# Patient Record
Sex: Male | Born: 1955
Health system: Southern US, Community
[De-identification: ages and names within clinical notes are randomized; demographics above are authoritative.]

## PROBLEM LIST (undated history)

## (undated) DIAGNOSIS — Z8669 Personal history of other diseases of the nervous system and sense organs: Secondary | ICD-10-CM

## (undated) DIAGNOSIS — T4145XA Adverse effect of unspecified anesthetic, initial encounter: Secondary | ICD-10-CM

## (undated) DIAGNOSIS — Z9889 Other specified postprocedural states: Secondary | ICD-10-CM

## (undated) DIAGNOSIS — E785 Hyperlipidemia, unspecified: Secondary | ICD-10-CM

## (undated) DIAGNOSIS — T8859XA Other complications of anesthesia, initial encounter: Secondary | ICD-10-CM

## (undated) DIAGNOSIS — S46219A Strain of muscle, fascia and tendon of other parts of biceps, unspecified arm, initial encounter: Secondary | ICD-10-CM

## (undated) DIAGNOSIS — R519 Headache, unspecified: Secondary | ICD-10-CM

## (undated) DIAGNOSIS — R51 Headache: Secondary | ICD-10-CM

## (undated) HISTORY — DX: Other specified postprocedural states: Z98.890

## (undated) HISTORY — DX: Hyperlipidemia, unspecified: E78.5

## (undated) HISTORY — DX: Personal history of other diseases of the nervous system and sense organs: Z86.69

## (undated) HISTORY — PX: SKULL FRACTURE ELEVATION: SHX781

---

## 1998-11-16 ENCOUNTER — Ambulatory Visit (HOSPITAL_COMMUNITY): Admission: RE | Admit: 1998-11-16 | Discharge: 1998-11-16 | Payer: Self-pay | Admitting: Specialist

## 1998-11-16 ENCOUNTER — Encounter: Payer: Self-pay | Admitting: Specialist

## 1999-10-31 ENCOUNTER — Encounter: Payer: Self-pay | Admitting: Emergency Medicine

## 1999-10-31 ENCOUNTER — Emergency Department (HOSPITAL_COMMUNITY): Admission: EM | Admit: 1999-10-31 | Discharge: 1999-10-31 | Payer: Self-pay | Admitting: Emergency Medicine

## 2002-07-28 ENCOUNTER — Emergency Department (HOSPITAL_COMMUNITY): Admission: EM | Admit: 2002-07-28 | Discharge: 2002-07-29 | Payer: Self-pay | Admitting: Emergency Medicine

## 2002-12-27 ENCOUNTER — Encounter: Payer: Self-pay | Admitting: Emergency Medicine

## 2002-12-27 ENCOUNTER — Emergency Department (HOSPITAL_COMMUNITY): Admission: EM | Admit: 2002-12-27 | Discharge: 2002-12-27 | Payer: Self-pay | Admitting: Emergency Medicine

## 2008-09-08 ENCOUNTER — Ambulatory Visit (HOSPITAL_COMMUNITY): Admission: RE | Admit: 2008-09-08 | Discharge: 2008-09-08 | Payer: Self-pay | Admitting: Family Medicine

## 2009-09-26 ENCOUNTER — Ambulatory Visit (HOSPITAL_COMMUNITY): Admission: RE | Admit: 2009-09-26 | Discharge: 2009-09-26 | Payer: Self-pay | Admitting: Family Medicine

## 2010-10-31 NOTE — Procedures (Signed)
Kenneth Moon, Kenneth Moon                  ACCOUNT NO.:  [id number]   MEDICAL RECORD NO.:  [id number]          PATIENT TYPE:  OUT   LOCATION:  [facility]                          FACILITY:  [facility]   PHYSICIAN:  Scott A. Gerda Diss, MD    DATE OF BIRTH:  1955-10-18   DATE OF PROCEDURE:  DATE OF DISCHARGE:                                  STRESS TEST   Standard Bruce protocol stress test.   INDICATIONS:  DOE with activity.   Resting EKG did not show any acute changes, had a normal ST segments,  slight delayed RS progression.  Resting blood pressure 122/78.   Heart rate responds to exercise.  The patient's heart rate gradually  increased in the maximum of 170 at stage IV.   ST responds to exercise.  The patient did not have any ST segment  depressions with exercise.   Arrhythmias.  The patient did not have any arrhythmias.   Symptomatology.  By the time, the patient hit his maximum heart rate, he  did have some slight increased respiratory effort.   Recovery phase - uneventful.  The patient did not have any symptoms of  chest pain or discomfort and the heart rate gradually slowed down in the  proper measure.  Blood pressure came down as well.   Blood pressure response to exercise.  The patient had a maximum  __________ and over all the patient did well in this regard.   Interpretation results - negative stress test.  It is felt that this was  medically indicated because of dyspnea on exertion that he was  experiencing with exercise.  I really told him that I think what is  happening is deconditioning some related to age.  I encouraged him to  exercise 30-40 minutes and keep in his target heart range of 130s to  140s in the low to mid 150s and that if he starts experiencing anything  unusual, he should follow up.  Otherwise, he ought to keep doing the  exercise and try to get it done at least 4 times a week.      Scott A. Gerda Diss, MD  Electronically Signed     SAL/MEDQ  D:  09/08/2008  T:   09/09/2008  Job:  [id number]

## 2012-11-25 ENCOUNTER — Encounter: Payer: Self-pay | Admitting: *Deleted

## 2012-11-26 ENCOUNTER — Encounter: Payer: Self-pay | Admitting: Family Medicine

## 2012-11-26 ENCOUNTER — Ambulatory Visit (INDEPENDENT_AMBULATORY_CARE_PROVIDER_SITE_OTHER): Payer: 59 | Admitting: Family Medicine

## 2012-11-26 VITALS — BP 140/82 | Wt 188.6 lb

## 2012-11-26 DIAGNOSIS — G43909 Migraine, unspecified, not intractable, without status migrainosus: Secondary | ICD-10-CM | POA: Insufficient documentation

## 2012-11-26 MED ORDER — SILDENAFIL CITRATE 100 MG PO TABS: 100.0000 mg | ORAL_TABLET | ORAL | Status: DC | PRN

## 2012-11-26 MED ORDER — SUMATRIPTAN SUCCINATE 50 MG PO TABS: ORAL_TABLET | ORAL | Status: DC

## 2012-11-26 NOTE — Progress Notes (Signed)
  Subjective:    Patient ID: Kenneth Moon, male    DOB: 02/05/1956, 57 y.o.   MRN: [id number]  Migraine  This is a chronic problem. The current episode started 1 to 4 weeks ago. The problem occurs intermittently.  Frequent headaches over the past several weeks. Up to 3 in a week. Now actually doing better. He states it may be related to the use of caffeine in his drinks. He states overall he feels he is doing pretty well. No other particular troubles. He states he didn't really have anything to take. He is actually doing better this week without any severe headaches. He does have a history of low testosterone and some erectile dysfunction denies any other particular problems 20 minutes spent with patient 99213.  3 within 1 week No additional stress Eating healthy and diet   Review of Systems Patient denies fever chills sweats nausea vomiting weight loss rectal bleeding. Denies awakening with the headaches. No double vision with it.    Objective:   Physical Exam Optic disc sharp pupils respond to light EOMI eardrums normal throat is normal neck is supple lungs are clear hearts regular extremities no edema blood pressure good       Assessment & Plan:  Migraines-if he starts having red flags such as double vision or waking at night with headache vomiting with headache for frequent headaches followup immediately otherwise lab work in the near future followup for a wellness exam. May use Imitrex when necessary for severe headache repeat 2 hours later if need be in addition to this consider Topamax if frequent headaches occur no need for imaging her scanning at this point.  Erectile dysfunction-Viagra 100 mg half tablet or whole tablet as directed. Patient does have a history of low testosterone but pops not to be on medication. I agree with this approach.

## 2013-01-05 ENCOUNTER — Encounter (HOSPITAL_COMMUNITY): Payer: Self-pay | Admitting: Emergency Medicine

## 2013-01-05 ENCOUNTER — Emergency Department (HOSPITAL_COMMUNITY): Payer: 59

## 2013-01-05 ENCOUNTER — Emergency Department (HOSPITAL_COMMUNITY)
Admission: EM | Admit: 2013-01-05 | Discharge: 2013-01-05 | Disposition: A | Discharge status: Home / Self Care | Payer: 59 | Attending: Emergency Medicine | Admitting: Emergency Medicine

## 2013-01-05 DIAGNOSIS — R0609 Other forms of dyspnea: Secondary | ICD-10-CM | POA: Insufficient documentation

## 2013-01-05 DIAGNOSIS — R079 Chest pain, unspecified: Secondary | ICD-10-CM

## 2013-01-05 DIAGNOSIS — R0989 Other specified symptoms and signs involving the circulatory and respiratory systems: Secondary | ICD-10-CM | POA: Insufficient documentation

## 2013-01-05 DIAGNOSIS — M546 Pain in thoracic spine: Secondary | ICD-10-CM | POA: Insufficient documentation

## 2013-01-05 DIAGNOSIS — E785 Hyperlipidemia, unspecified: Secondary | ICD-10-CM | POA: Insufficient documentation

## 2013-01-05 DIAGNOSIS — M545 Low back pain, unspecified: Secondary | ICD-10-CM | POA: Insufficient documentation

## 2013-01-05 DIAGNOSIS — G8929 Other chronic pain: Secondary | ICD-10-CM | POA: Insufficient documentation

## 2013-01-05 DIAGNOSIS — R110 Nausea: Secondary | ICD-10-CM | POA: Insufficient documentation

## 2013-01-05 DIAGNOSIS — Z8679 Personal history of other diseases of the circulatory system: Secondary | ICD-10-CM | POA: Insufficient documentation

## 2013-01-05 DIAGNOSIS — M542 Cervicalgia: Secondary | ICD-10-CM | POA: Insufficient documentation

## 2013-01-05 DIAGNOSIS — R071 Chest pain on breathing: Secondary | ICD-10-CM | POA: Insufficient documentation

## 2013-01-05 LAB — POCT I-STAT TROPONIN I: Troponin i, poc: 0.02 ng/mL (ref 0.00–0.08)

## 2013-01-05 LAB — CBC
Hemoglobin: 13.8 g/dL (ref 13.0–17.0)
MCH: 29.6 pg (ref 26.0–34.0)
MCV: 89.3 fL (ref 78.0–100.0)
Platelets: 200 10*3/uL (ref 150–400)
RBC: 4.66 MIL/uL (ref 4.22–5.81)

## 2013-01-05 LAB — COMPREHENSIVE METABOLIC PANEL
CO2: 28 mEq/L (ref 19–32)
Calcium: 9.2 mg/dL (ref 8.4–10.5)
Creatinine, Ser: 1.01 mg/dL (ref 0.50–1.35)
GFR calc Af Amer: >90 mL/min (ref >90)
GFR calc non Af Amer: 82 mL/min — ABNORMAL LOW (ref >90)
Glucose, Bld: 96 mg/dL (ref 70–99)

## 2013-01-05 MED ORDER — IOHEXOL 350 MG/ML SOLN: 100.0000 mL | Freq: Once | INTRAVENOUS | Status: DC | PRN

## 2013-01-05 NOTE — ED Notes (Signed)
Pt here for c/o chest pain that started last week and today pt stated that he called his PCP they told him to come to the ED denies sweating or n/v but stated that he has some sob and pain in his neck and shoulder blade

## 2013-01-05 NOTE — ED Provider Notes (Signed)
History    CSN: [id number] Arrival date & time 01/05/13  1516  First MD Initiated Contact with Patient 01/05/13 1545     Chief Complaint  Patient presents with  . Chest Pain   (Consider location/radiation/quality/duration/timing/severity/associated sxs/prior Treatment) HPI Comments: [name] is a 57 year old man with a history of hyperlipidemia who presents today the emergency department with 10 days of chest tightness. He states that the chest tightness substernal and after approximately 10 or 20 minutes becomes sharp pain between his shoulder blades and in his upper back.  Today while sitting at work he had a similar episode but also had a sharp right-sided neck pain occurring with the episode. Episode just prior to arrival at work lasted approximately 45 minutes.  He reports associated symptoms of feeling dyspnea when the chest pain happens. He did have some nausea with this episode today. He denies diaphoresis.  He is currently pain-free. He denies any previous episodes. He denies any relieving or exacerbating factors.  Patient is a 57 y.o. male presenting with chest pain.  Chest Pain Associated symptoms: no cough and no fever    Past Medical History  Diagnosis Date  . Hyperlipidemia    History reviewed. No pertinent past surgical history. No family history on file. History  Substance Use Topics  . Smoking status: Never Smoker   . Smokeless tobacco: Not on file  . Alcohol Use: No    Family History : His mother died from pancreatic cancer; father had coronary artery disease at 30 years of age  Review of Systems  Constitutional: Negative for fever and appetite change.  HENT: Negative.  Negative for congestion and rhinorrhea.   Eyes: Negative.   Respiratory: Negative for cough and wheezing.        Pt reports episodes of dyspnea along with the episodes of chest pain. Has pleurisy with the chest pain  Cardiovascular: Positive for chest pain.       See history of present  illness  Gastrointestinal: Negative.   Endocrine: Negative.   Genitourinary: Negative.   Musculoskeletal:       Patient reports chronic lower back pain. He also reports upper back pain as per history of present illness  Skin: Negative.   Allergic/Immunologic: Negative.   Neurological:       Increase in migraine symptoms over the past month  Hematological: Negative.   Psychiatric/Behavioral: Negative.   All other systems reviewed and are negative.    Allergies  Allegra and Lipitor  Home Medications   Current Outpatient Rx  Name  Route  Sig  Dispense  Refill  . sildenafil (VIAGRA) 100 MG tablet   Oral   Take 1 tablet (100 mg total) by mouth as needed for erectile dysfunction.   8 tablet   4   . SUMAtriptan (IMITREX) 50 MG tablet      1 at first sign of migraine, then repeat in 2 hours if needed   10 tablet   2    BP 139/67  Pulse 72  Temp(Src) 98.2 F (36.8 C) (Oral)  Resp 20  SpO2 99% Physical Exam  Nursing note and vitals reviewed. CONSTITUTIONAL  Pleasant, well developed, well nourished, alert, non toxic appearing HEENT  normocephalic, atraumatic, external ears normal, nose normal, mucus membranes moist, oropharynx clear EYES  normal conjunctiva, no sclera icterus noted, EOM intact, PERRL NECK  normal ROM, supple, no JVD, trachea normal, no mass CARDIOVASCULAR  RRR. no m/r/g. 2+ radial, femoral and pedal pulses. No LEE edema or  calf TTP PULMONARY/CHEST WALL  normal effort, no respiratory distress, BS normal, no wheezes, no rhonchi, no rales ABDOMINAL  normal appearance, no distension, no mass, no pulsatile mass; soft, non tender, no rigidity, guarding or rebound GU  deferred MUSCULOSKELETAL  Normal range of motion of extremities;  NEUROLOGICAL  patient is awake and responsive;  no significant motor or sensory deficit noted  SKIN  warm, dry, intact, no rash PSYCHIATRIC  No suicidal or homicidal ideations;  Normal affect and expected cognition   ED Course   Procedures (including critical care time) Labs Reviewed  TROPONIN I  CBC  COMPREHENSIVE METABOLIC PANEL  LIPASE, BLOOD   EKG: Normal sinus rhythm rate of 66; PR interval normal; QRS within normal limits; QTC 4:15; borderline R-wave progression in anterior leads.  Results for orders placed during the hospital encounter of 01/05/13  TROPONIN I      Result Value Range   Troponin I <0.30  <0.30 ng/mL  CBC      Result Value Range   WBC 7.5  4.0 - 10.5 K/uL   RBC 4.66  4.22 - 5.81 MIL/uL   Hemoglobin 13.8  13.0 - 17.0 g/dL   HCT 16.1  09.6 - 04.5 %   MCV 89.3  78.0 - 100.0 fL   MCH 29.6  26.0 - 34.0 pg   MCHC 33.2  30.0 - 36.0 g/dL   RDW 40.9  81.1 - 91.4 %   Platelets 200  150 - 400 K/uL  COMPREHENSIVE METABOLIC PANEL      Result Value Range   Sodium 140  135 - 145 mEq/L   Potassium 3.8  3.5 - 5.1 mEq/L   Chloride 104  96 - 112 mEq/L   CO2 28  19 - 32 mEq/L   Glucose, Bld 96  70 - 99 mg/dL   BUN 18  6 - 23 mg/dL   Creatinine, Ser 7.82  0.50 - 1.35 mg/dL   Calcium 9.2  8.4 - 95.6 mg/dL   Total Protein 6.9  6.0 - 8.3 g/dL   Albumin 3.7  3.5 - 5.2 g/dL   AST 20  0 - 37 U/L   ALT 16  0 - 53 U/L   Alkaline Phosphatase 61  39 - 117 U/L   Total Bilirubin 0.4  0.3 - 1.2 mg/dL   GFR calc non Af Amer 82 (*) >90 mL/min   GFR calc Af Amer >90  >90 mL/min  LIPASE, BLOOD      Result Value Range   Lipase 19  11 - 59 U/L  TROPONIN I      Result Value Range   Troponin I <0.30  <0.30 ng/mL  POCT I-STAT TROPONIN I      Result Value Range   Troponin i, poc 0.02  0.00 - 0.08 ng/mL   Comment 3            Dg Chest 2 View  01/05/2013   *RADIOLOGY REPORT*  Clinical Data: Chest pain and shortness of breath  CHEST - 2 VIEW  Comparison: None.  Findings:  Lungs are clear.  Heart size and pulmonary vascularity are normal.  No adenopathy.  No pneumothorax.  There is mild degenerative change in the thoracic spine.  IMPRESSION: No edema or consolidation.   Original Report Authenticated By: Bretta Bang, M.D.   Ct Angio Abdomen W/cm &/or Wo Contrast  01/05/2013   *RADIOLOGY REPORT*  Clinical Data:  Chest pain.  Aortic dissection.  Back pain. Hypotension.  CT  ANGIOGRAPHY CHEST AND ABDOMEN  Technique:  Multidetector CT imaging of the chest and abdomen was performed using the standard protocol during bolus administration of intravenous contrast.  Multiplanar CT image reconstructions including MIPs were obtained to evaluate the vascular anatomy.  Contrast:  100 ml Omnipaque 350.  Comparison:   None.  CTA CHEST  Findings:  Noncontrast imaging the chest demonstrates aortic arch atherosclerosis.  There is no intramural hematoma.  After contrast administration, normal three-vessel aortic arch is identified.  There is no dissection or acute aortic abnormality. The heart appears within normal limits allowing for motion artifact.  There is no pericardial or pleural effusion.  No axillary, mediastinal, or hilar adenopathy.  The lungs shows scattered areas of atelectasis.  There is no airspace disease. There are no aggressive osseous lesions.   Review of the MIP images confirms the above findings.  IMPRESSION: Negative CTA chest.  Negative for dissection.  CTA ABDOMEN  Findings:  Normal appearance of the liver.  Spleen appears within normal limits as well.  Gallbladder, common bile duct and pancreas appear within normal limits.  Bilaterally the adrenal glands appear normal.  There is a left upper pole simple appearing renal cyst. Normal renal enhancement.  Mild respiratory motion artifact is present.  The abdominal aorta is within normal limits.  Single renal arteries are present bilaterally.  The superior mesenteric artery, celiac axis and nine and eight are all patent.  Both common iliac arteries appear normal.  There is no free air or acute intra-abdominal abnormality.   Review of the MIP images confirms the above findings.  IMPRESSION: Negative CTA of the abdomen.  Left renal cyst.   Original Report  Authenticated By: Andreas Newport, M.D.   Ct Angio Chest Aortic Dissect W &/or W/o  01/05/2013   *RADIOLOGY REPORT*  Clinical Data:  Chest pain.  Aortic dissection.  Back pain. Hypotension.  CT ANGIOGRAPHY CHEST AND ABDOMEN  Technique:  Multidetector CT imaging of the chest and abdomen was performed using the standard protocol during bolus administration of intravenous contrast.  Multiplanar CT image reconstructions including MIPs were obtained to evaluate the vascular anatomy.  Contrast:  100 ml Omnipaque 350.  Comparison:   None.  CTA CHEST  Findings:  Noncontrast imaging the chest demonstrates aortic arch atherosclerosis.  There is no intramural hematoma.  After contrast administration, normal three-vessel aortic arch is identified.  There is no dissection or acute aortic abnormality. The heart appears within normal limits allowing for motion artifact.  There is no pericardial or pleural effusion.  No axillary, mediastinal, or hilar adenopathy.  The lungs shows scattered areas of atelectasis.  There is no airspace disease. There are no aggressive osseous lesions.   Review of the MIP images confirms the above findings.  IMPRESSION: Negative CTA chest.  Negative for dissection.  CTA ABDOMEN  Findings:  Normal appearance of the liver.  Spleen appears within normal limits as well.  Gallbladder, common bile duct and pancreas appear within normal limits.  Bilaterally the adrenal glands appear normal.  There is a left upper pole simple appearing renal cyst. Normal renal enhancement.  Mild respiratory motion artifact is present.  The abdominal aorta is within normal limits.  Single renal arteries are present bilaterally.  The superior mesenteric artery, celiac axis and nine and eight are all patent.  Both common iliac arteries appear normal.  There is no free air or acute intra-abdominal abnormality.   Review of the MIP images confirms the above findings.  IMPRESSION: Negative CTA  of the abdomen.  Left renal cyst.    Original Report Authenticated By: Andreas Newport, M.D.      MDM  TIMI risk score of 1 for multiple episodes of angina. EKG shows no significant ST changes. Troponin x2 are negative. Patient's clinical history does not suggest a PE. He does report that the pain radiates to between his shoulder blades; therefore, CT aortogram was ordered and negative. The rest of his labs are unremarkable there are no signs of pneumonia, pneumothorax, pericarditis, or esophageal rupture.  I spoke to the patient at length regarding differential diagnosis, need for followup, and strict return precautions.  Ashby Dawes, MD 01/05/13 2128

## 2013-01-07 ENCOUNTER — Other Ambulatory Visit: Payer: Self-pay | Admitting: Cardiology

## 2013-01-07 ENCOUNTER — Encounter: Payer: Self-pay | Admitting: *Deleted

## 2013-01-07 ENCOUNTER — Encounter: Payer: Self-pay | Admitting: Family Medicine

## 2013-01-07 ENCOUNTER — Ambulatory Visit (INDEPENDENT_AMBULATORY_CARE_PROVIDER_SITE_OTHER): Payer: 59 | Admitting: Family Medicine

## 2013-01-07 ENCOUNTER — Ambulatory Visit (INDEPENDENT_AMBULATORY_CARE_PROVIDER_SITE_OTHER): Payer: 59 | Admitting: Cardiology

## 2013-01-07 ENCOUNTER — Encounter: Payer: Self-pay | Admitting: Cardiology

## 2013-01-07 VITALS — BP 120/80 | Wt 188.4 lb

## 2013-01-07 VITALS — BP 117/77 | HR 60 | Ht 66.0 in | Wt 178.2 lb

## 2013-01-07 DIAGNOSIS — R079 Chest pain, unspecified: Secondary | ICD-10-CM

## 2013-01-07 DIAGNOSIS — E785 Hyperlipidemia, unspecified: Secondary | ICD-10-CM

## 2013-01-07 DIAGNOSIS — E782 Mixed hyperlipidemia: Secondary | ICD-10-CM

## 2013-01-07 DIAGNOSIS — I200 Unstable angina: Secondary | ICD-10-CM

## 2013-01-07 DIAGNOSIS — R9431 Abnormal electrocardiogram [ECG] [EKG]: Secondary | ICD-10-CM | POA: Insufficient documentation

## 2013-01-07 DIAGNOSIS — Z01818 Encounter for other preprocedural examination: Secondary | ICD-10-CM

## 2013-01-07 DIAGNOSIS — Z79899 Other long term (current) drug therapy: Secondary | ICD-10-CM

## 2013-01-07 MED ORDER — NITROGLYCERIN 0.4 MG SL SUBL: 0.4000 mg | SUBLINGUAL_TABLET | SUBLINGUAL | Status: DC | PRN

## 2013-01-07 NOTE — Patient Instructions (Addendum)
Your physician recommends that you schedule a follow-up appointment in: TO BE DETERMINED  Your physician has requested that you have a cardiac catheterization. Cardiac catheterization is used to diagnose and/or treat various heart conditions. Doctors may recommend this procedure for a number of different reasons. The most common reason is to evaluate chest pain. Chest pain can be a symptom of coronary artery disease (CAD), and cardiac catheterization can show whether plaque is narrowing or blocking your heart's arteries. This procedure is also used to evaluate the valves, as well as measure the blood flow and oxygen levels in different parts of your heart. For further information please visit [email or web address] Please follow instruction sheet, as given.01-09-13 AT 8:30AM  Your physician has recommended you make the following change in your medication:   1) HOLD VIAGRA AT THIS TIME UNTIL AFTER YOUR PROCEDURE  Your physician recommends that you return for lab work in: TODAY (SLIPS GIVEN FOR BMET,PT,PT/INR) NOTED PT TO HAVE BMET DRAWN FOR PCP TODAY, ADVISED PT TO TAKE ORDER FORMS WITH HIM TO HAVE LAB CLARIFY ORDERS GIVEN TO COMBINE NEED FROM BOTH PCP AND DR SM

## 2013-01-07 NOTE — Assessment & Plan Note (Signed)
Has not been on medical therapy, reports allergy to Lipitor previously. Followup FLP is pending.

## 2013-01-07 NOTE — Assessment & Plan Note (Signed)
Recent onset symptoms as outlined concerning for accelerating angina. ECG shows poor R wave progression anteriorly, recent cardiac markers were normal however. Principle risk factors include age and gender, history of hyperlipidemia. Followup FLP is pending at this time. We have discussed diagnostic possibilities, both noninvasive and invasive techniques, after reviewing the risks and benefits, plan is to proceed with a diagnostic cardiac catheterization to most clearly evaluate his coronary anatomy and determine if any revascularization options need to be considered. Have asked him not to use Viagra in the short term, particularly not along with nitroglycerin. Continue aspirin. Check coagulation studies to complete recent bloodwork.

## 2013-01-07 NOTE — Progress Notes (Signed)
  Subjective:    Patient ID: Kenneth Ponto., male    DOB: June 08, 1956, 57 y.o.   MRN: [id number]  HPI This patient presents today having intermittent chest discomforts over the past few weeks more progressive over this past weekend he noted chest discomfort with playing golf then he noticed chest discomfort on Monday morning that radiated into his neck then he noticed earlier today chest discomfort with walking the dog. With one of the episodes he did have shortness of breath and felt somewhat clammy. He denies breaking out in any sweats or having any wheezing or any type of respiratory illness recently. He had been doing some yard work for his mother but he did not think that caused his chest discomfort. Patient has not had this problem before. He does have a history hyperlipidemia for which he was being treated but approximately 3 years ago he stopped taking the medication and he related that the medication did cause some discomfort. He is not a smoker. He does try to eat relatively healthy. Family history reviewed   Review of Systems See above. Records from the emergency department were reviewed with the patient while he was there I went over his lab work also CT and she'll and his EKG.    Objective:   Physical Exam Lungs are clear heart is regular no murmurs. Pulses are normal. Abdomen soft slightly obese extremities no edema skin warm dry neurologic grossly normal  EKG was repeated has some lateral changes but nothing obvious. Seemingly comparable to what was completed in the ER on the 22nd  Dr. Simona Huh was spoken with regarding the patient's issues. They agreed to see the patient later today       Assessment & Plan:  Chest discomfort-worrisome for cardiac origin. This patient at the very least will need a stress Myoview but because of the crescendo of his symptoms catheterization may be necessary in order to be certain that there is not vascular blockage is building up. He will go  ahead and do lab work in more than likely will need to start on a different statin. Await cardiology consultation.

## 2013-01-07 NOTE — Assessment & Plan Note (Signed)
As noted above. Recent cardiac markers normal.

## 2013-01-07 NOTE — Progress Notes (Signed)
Clinical Summary Kenneth Moon is a 57 y.o.male referred for cardiology consultation by Dr. Gerda Diss who saw the patient earlier today. He presents with a 2 week history of recurring chest "tightness" that began after he had been off from work, doing a lot of activities around the home and outdoors. Symptoms have been recurring with exertion, sometimes at rest, also associated with a discomfort in his mid shoulder blade area radiating up into the neck. Symptoms have become more frequent. He was seen in the ER recently on the 21st, normal troponin I at that time. Chest x-ray showed no acute findings. Chest CTA demonstrated aortic arch atherosclerosis, no hematoma or dissection.  Today's ECG shows sinus rhythm at 82 with leftward axis, lead motion artifact, and poor R-wave progression in the anterior leads, rule out old infarct pattern. Records indicate a negative GXT in 2010. He has had no followup ischemic testing since then.  At baseline, he reports no exertional chest pain or breathlessness typically. He has not been on medical therapy for hyperlipidemia for several years.   Allergies  Allergen Reactions  . Allegra (Fexofenadine Hcl)     Felt bad  . Lipitor (Atorvastatin)     Headache    Current Outpatient Prescriptions  Medication Sig Dispense Refill  . aspirin 81 MG tablet Take 81 mg by mouth daily.      . nitroGLYCERIN (NITROSTAT) 0.4 MG SL tablet Place 1 tablet (0.4 mg total) under the tongue every 5 (five) minutes as needed for chest pain. For a total of 3 doses if needed  30 tablet  1  . sildenafil (VIAGRA) 100 MG tablet Take 1 tablet (100 mg total) by mouth as needed for erectile dysfunction.  8 tablet  4  . SUMAtriptan (IMITREX) 50 MG tablet 1 at first sign of migraine, then repeat in 2 hours if needed  10 tablet  2   No current facility-administered medications for this visit.    Past Medical History  Diagnosis Date  . Hyperlipidemia   . History of migraine headaches     Past  Surgical History  Procedure Laterality Date  . Skull fracture elevation      Age 82    Family History  Problem Relation Age of Onset  . Cancer Mother   . Cancer Father   . CAD Father     Not premature    Social History Mr. Schoof reports that he has never smoked. He does not have any smokeless tobacco history on file. Mr. Ludington reports that he does not drink alcohol.  Review of Systems No palpitations, dizziness, syncope. No reported bleeding episodes. No orthopnea or PND. No claudication. Otherwise negative.  Physical Examination Filed Vitals:   01/07/13 1220  BP: 117/77  Pulse: 60   Filed Weights   01/07/13 1220  Weight: 178 lb 4 oz (80.854 kg)   Patient appears comfortable at rest. HEENT: Conjunctiva and lids normal, oropharynx clear. Neck: Supple, no elevated JVP or carotid bruits, no thyromegaly. Lungs: Clear to auscultation, nonlabored breathing at rest. Cardiac: Regular rate and rhythm, no S3 or significant systolic murmur, no pericardial rub. Abdomen: Soft, nontender, bowel sounds present, no guarding or rebound. Extremities: No pitting edema, distal pulses 2+. Skin: Warm and dry. Musculoskeletal: No kyphosis. Neuropsychiatric: Alert and oriented x3, affect grossly appropriate.   Problem List and Plan   Accelerating angina Recent onset symptoms as outlined concerning for accelerating angina. ECG shows poor R wave progression anteriorly, recent cardiac markers were normal however. Principle  risk factors include age and gender, history of hyperlipidemia. Followup FLP is pending at this time. We have discussed diagnostic possibilities, both noninvasive and invasive techniques, after reviewing the risks and benefits, plan is to proceed with a diagnostic cardiac catheterization to most clearly evaluate his coronary anatomy and determine if any revascularization options need to be considered. Have asked him not to use Viagra in the short term, particularly not along with  nitroglycerin. Continue aspirin. Check coagulation studies to complete recent bloodwork.  Hyperlipidemia Has not been on medical therapy, reports allergy to Lipitor previously. Followup FLP is pending.  Abnormal ECG As noted above. Recent cardiac markers normal.    Jonelle Sidle, M.D., F.A.C.C.

## 2013-01-08 LAB — HEPATIC FUNCTION PANEL
Albumin: 3.8 g/dL (ref 3.5–5.2)
Alkaline Phosphatase: 55 U/L (ref 39–117)
Indirect Bilirubin: 0.5 mg/dL (ref 0.0–0.9)
Total Bilirubin: 0.6 mg/dL (ref 0.3–1.2)

## 2013-01-08 LAB — BASIC METABOLIC PANEL
BUN: 16 mg/dL (ref 6–23)
CO2: 30 mEq/L (ref 19–32)
Chloride: 106 mEq/L (ref 96–112)
Creat: 1.09 mg/dL (ref 0.50–1.35)

## 2013-01-08 LAB — LIPID PANEL: LDL Cholesterol: 208 mg/dL — ABNORMAL HIGH (ref 0–99)

## 2013-01-08 LAB — APTT: aPTT: 30 seconds (ref 24–37)

## 2013-01-09 ENCOUNTER — Encounter (HOSPITAL_BASED_OUTPATIENT_CLINIC_OR_DEPARTMENT_OTHER)
Admission: RE | Disposition: A | Discharge status: Home / Self Care | Payer: Self-pay | Source: Ambulatory Visit | Attending: Cardiology

## 2013-01-09 ENCOUNTER — Inpatient Hospital Stay (HOSPITAL_BASED_OUTPATIENT_CLINIC_OR_DEPARTMENT_OTHER)
Admission: RE | Admit: 2013-01-09 | Discharge: 2013-01-09 | Disposition: A | Discharge status: Home / Self Care | Payer: 59 | Source: Ambulatory Visit | Attending: Cardiology | Admitting: Cardiology

## 2013-01-09 DIAGNOSIS — R9431 Abnormal electrocardiogram [ECG] [EKG]: Secondary | ICD-10-CM | POA: Insufficient documentation

## 2013-01-09 DIAGNOSIS — E7800 Pure hypercholesterolemia, unspecified: Secondary | ICD-10-CM | POA: Insufficient documentation

## 2013-01-09 DIAGNOSIS — I200 Unstable angina: Secondary | ICD-10-CM

## 2013-01-09 DIAGNOSIS — R079 Chest pain, unspecified: Secondary | ICD-10-CM | POA: Insufficient documentation

## 2013-01-09 SURGERY — JV LEFT HEART CATHETERIZATION WITH CORONARY ANGIOGRAM
Anesthesia: Moderate Sedation

## 2013-01-09 MED ORDER — SODIUM CHLORIDE 0.9 % IV SOLN: INTRAVENOUS | Status: DC

## 2013-01-09 MED ORDER — ASPIRIN 81 MG PO CHEW
324.0000 mg | CHEWABLE_TABLET | ORAL | Status: AC
  Administered 2013-01-09: 243 mg via ORAL

## 2013-01-09 MED ORDER — SODIUM CHLORIDE 0.9 % IV SOLN: 250.0000 mL | INTRAVENOUS | Status: DC | PRN

## 2013-01-09 MED ORDER — SODIUM CHLORIDE 0.9 % IJ SOLN: 3.0000 mL | Freq: Two times a day (BID) | INTRAMUSCULAR | Status: DC

## 2013-01-09 MED ORDER — SODIUM CHLORIDE 0.9 % IJ SOLN
3.0000 mL | INTRAMUSCULAR | Status: DC | PRN
Start: 2013-01-09 — End: 2013-01-09

## 2013-01-09 MED ORDER — ONDANSETRON HCL 4 MG/2ML IJ SOLN: 4.0000 mg | Freq: Four times a day (QID) | INTRAMUSCULAR | Status: DC | PRN

## 2013-01-09 MED ORDER — SODIUM CHLORIDE 0.9 % IV SOLN: 1.0000 mL/kg/h | INTRAVENOUS | Status: DC

## 2013-01-09 MED ORDER — ACETAMINOPHEN 325 MG PO TABS: 650.0000 mg | ORAL_TABLET | ORAL | Status: DC | PRN

## 2013-01-09 NOTE — OR Nursing (Signed)
Negative Allen's test right hand 

## 2013-01-09 NOTE — OR Nursing (Signed)
Tegaderm dressing applied, site level 0, bedrest begins at 0930 

## 2013-01-09 NOTE — CV Procedure (Signed)
   Cardiac Catheterization Procedure Note  Name: Kenneth Moon. MRN: [id number] DOB: December 13, 1955  Procedure: Left Heart Cath, Selective Coronary Angiography, LV angiography  Indication: 57 yo WM with recent onset of exertional chest pain. History of hypercholesterolemia.   Procedural details: The right groin was prepped, draped, and anesthetized with 1% lidocaine. Using modified Seldinger technique, a 4 French sheath was introduced into the right femoral artery. Standard Judkins catheters were used for coronary angiography and left ventriculography. Catheter exchanges were performed over a guidewire. There were no immediate procedural complications. The patient was transferred to the post catheterization recovery area for further monitoring.  Procedural Findings: Hemodynamics:  AO 109/64 mean 85 mm Hg LV 108/17 mm Hg   Coronary angiography: Coronary dominance: right  Left mainstem: Normal  Left anterior descending (LAD): Normal  Left circumflex (LCx): Gives rise to a single large OM then terminates in the AV groove. Normal.  Right coronary artery (RCA): Large, dominant. Normal.  Left ventriculography: Left ventricular systolic function is normal, LVEF is estimated at 55-65%, there is no significant mitral regurgitation   Final Conclusions:   1. Normal coronary anatomy 2. Normal LV function  Recommendations: Risk factor modification.  Theron Arista [facility] 01/09/2013, 9:21 AM

## 2013-01-09 NOTE — Interval H&P Note (Signed)
History and Physical Interval Note:  01/09/2013 9:00 AM  Kenneth E Feltman Jr.  has presented today for surgery, with the diagnosis of abn ekg  The various methods of treatment have been discussed with the patient and family. After consideration of risks, benefits and other options for treatment, the patient has consented to  Procedure(s): JV LEFT HEART CATHETERIZATION WITH CORONARY ANGIOGRAM (N/A) as a surgical intervention .  The patient's history has been reviewed, patient examined, no change in status, stable for surgery.  I have reviewed the patient's chart and labs.  Questions were answered to the patient's satisfaction.    Cath Lab Visit (complete for each Cath Lab visit)  Clinical Evaluation Leading to the Procedure:   ACS: no  Non-ACS:    Anginal Classification: CCS II  Anti-ischemic medical therapy: No Therapy  Non-Invasive Test Results: No non-invasive testing performed  Prior CABG: No previous CABG       Theron Arista [facility] 01/09/2013 9:00 AM

## 2013-01-09 NOTE — OR Nursing (Signed)
Dr Jordan at bedside to discuss results and treatment plan with pt and family 

## 2013-01-09 NOTE — H&P (View-Only) (Signed)
 Clinical Summary [name] is a 57 y.o.male referred for cardiology consultation by Dr. Luking who saw the patient earlier today. He presents with a 2 week history of recurring chest "tightness" that began after he had been off from work, doing a lot of activities around the home and outdoors. Symptoms have been recurring with exertion, sometimes at rest, also associated with a discomfort in his mid shoulder blade area radiating up into the neck. Symptoms have become more frequent. He was seen in the ER recently on the 21st, normal troponin I at that time. Chest x-ray showed no acute findings. Chest CTA demonstrated aortic arch atherosclerosis, no hematoma or dissection.  Today's ECG shows sinus rhythm at 82 with leftward axis, lead motion artifact, and poor R-wave progression in the anterior leads, rule out old infarct pattern. Records indicate a negative GXT in 2010. He has had no followup ischemic testing since then.  At baseline, he reports no exertional chest pain or breathlessness typically. He has not been on medical therapy for hyperlipidemia for several years.   Allergies  Allergen Reactions  . Allegra (Fexofenadine Hcl)     Felt bad  . Lipitor (Atorvastatin)     Headache    Current Outpatient Prescriptions  Medication Sig Dispense Refill  . aspirin 81 MG tablet Take 81 mg by mouth daily.      . nitroGLYCERIN (NITROSTAT) 0.4 MG SL tablet Place 1 tablet (0.4 mg total) under the tongue every 5 (five) minutes as needed for chest pain. For a total of 3 doses if needed  30 tablet  1  . sildenafil (VIAGRA) 100 MG tablet Take 1 tablet (100 mg total) by mouth as needed for erectile dysfunction.  8 tablet  4  . SUMAtriptan (IMITREX) 50 MG tablet 1 at first sign of migraine, then repeat in 2 hours if needed  10 tablet  2   No current facility-administered medications for this visit.    Past Medical History  Diagnosis Date  . Hyperlipidemia   . History of migraine headaches     Past  Surgical History  Procedure Laterality Date  . Skull fracture elevation      Age 7    Family History  Problem Relation Age of Onset  . Cancer Mother   . Cancer Father   . CAD Father     Not premature    Social History Mr. Revoir reports that he has never smoked. He does not have any smokeless tobacco history on file. Mr. Venuti reports that he does not drink alcohol.  Review of Systems No palpitations, dizziness, syncope. No reported bleeding episodes. No orthopnea or PND. No claudication. Otherwise negative.  Physical Examination Filed Vitals:   01/07/13 1220  BP: 117/77  Pulse: 60   Filed Weights   01/07/13 1220  Weight: 178 lb 4 oz (80.854 kg)   Patient appears comfortable at rest. HEENT: Conjunctiva and lids normal, oropharynx clear. Neck: Supple, no elevated JVP or carotid bruits, no thyromegaly. Lungs: Clear to auscultation, nonlabored breathing at rest. Cardiac: Regular rate and rhythm, no S3 or significant systolic murmur, no pericardial rub. Abdomen: Soft, nontender, bowel sounds present, no guarding or rebound. Extremities: No pitting edema, distal pulses 2+. Skin: Warm and dry. Musculoskeletal: No kyphosis. Neuropsychiatric: Alert and oriented x3, affect grossly appropriate.   Problem List and Plan   Accelerating angina Recent onset symptoms as outlined concerning for accelerating angina. ECG shows poor R wave progression anteriorly, recent cardiac markers were normal however. Principle   risk factors include age and gender, history of hyperlipidemia. Followup FLP is pending at this time. We have discussed diagnostic possibilities, both noninvasive and invasive techniques, after reviewing the risks and benefits, plan is to proceed with a diagnostic cardiac catheterization to most clearly evaluate his coronary anatomy and determine if any revascularization options need to be considered. Have asked him not to use Viagra in the short term, particularly not along with  nitroglycerin. Continue aspirin. Check coagulation studies to complete recent bloodwork.  Hyperlipidemia Has not been on medical therapy, reports allergy to Lipitor previously. Followup FLP is pending.  Abnormal ECG As noted above. Recent cardiac markers normal.    Samuel G. McDowell, M.D., F.A.C.C.   

## 2013-01-09 NOTE — OR Nursing (Signed)
Meal served 

## 2013-01-09 NOTE — OR Nursing (Signed)
Discharge instructions reviewed and signed, pt stated understanding, ambulated in hall without difficulty, site level 0, transported to wife's car via wheelchair 

## 2013-01-12 MED ORDER — ROSUVASTATIN CALCIUM 10 MG PO TABS: 10.0000 mg | ORAL_TABLET | Freq: Every day | ORAL | Status: DC

## 2013-01-12 NOTE — Addendum Note (Signed)
Addended by: Margaretha Sheffield on: 01/12/2013 10:04 AM   Modules accepted: Orders

## 2013-01-20 ENCOUNTER — Encounter: Payer: Self-pay | Admitting: Family Medicine

## 2013-01-23 ENCOUNTER — Encounter: Payer: Self-pay | Admitting: Adult Health

## 2013-01-23 ENCOUNTER — Ambulatory Visit (INDEPENDENT_AMBULATORY_CARE_PROVIDER_SITE_OTHER): Payer: 59 | Admitting: Adult Health

## 2013-01-23 VITALS — BP 129/83 | HR 75 | Ht 66.0 in | Wt 192.0 lb

## 2013-01-23 DIAGNOSIS — I200 Unstable angina: Secondary | ICD-10-CM

## 2013-01-23 DIAGNOSIS — I10 Essential (primary) hypertension: Secondary | ICD-10-CM

## 2013-01-23 DIAGNOSIS — E785 Hyperlipidemia, unspecified: Secondary | ICD-10-CM

## 2013-01-23 NOTE — Progress Notes (Deleted)
Name: Kenneth Moon.    DOB: 14-Jul-1955  Age: 57 y.o.  MR#: [id number]       PCP:  Lilyan Punt, MD      Insurance: Payor: Advertising copywriter / Plan: Advertising copywriter / Product Type: *No Product type* /   CC:    Chief Complaint  Patient presents with  . Chest Pain    VS Filed Vitals:   01/23/13 1403  BP: 129/83  Pulse: 75  Height: 5\' 6"  (1.676 m)  Weight: 192 lb (87.091 kg)    Weights Current Weight  01/23/13 192 lb (87.091 kg)  01/09/13 178 lb (80.74 kg)  01/09/13 178 lb (80.74 kg)    Blood Pressure  BP Readings from Last 3 Encounters:  01/23/13 129/83  01/09/13 129/82  01/09/13 129/82     Admit date:  (Not on file) Last encounter with RMR:  Visit date not found   Allergy Allegra and Lipitor  Current Outpatient Prescriptions  Medication Sig Dispense Refill  . aspirin 81 MG tablet Take 81 mg by mouth daily.       No current facility-administered medications for this visit.    Discontinued Meds:    Medications Discontinued During This Encounter  Medication Reason  . rosuvastatin (CRESTOR) 10 MG tablet Error  . SUMAtriptan (IMITREX) 50 MG tablet Error  . sildenafil (VIAGRA) 100 MG tablet Error    Patient Active Problem List   Diagnosis Date Noted  . Accelerating angina 01/07/2013  . Abnormal ECG 01/07/2013  . Hyperlipidemia 01/07/2013  . Migraines 11/26/2012    LABS    Component Value Date/Time   NA 140 01/07/2013 1115   NA 140 01/05/2013 1600   K 4.5 01/07/2013 1115   K 3.8 01/05/2013 1600   CL 106 01/07/2013 1115   CL 104 01/05/2013 1600   CO2 30 01/07/2013 1115   CO2 28 01/05/2013 1600   GLUCOSE 85 01/07/2013 1115   GLUCOSE 96 01/05/2013 1600   BUN 16 01/07/2013 1115   BUN 18 01/05/2013 1600   CREATININE 1.09 01/07/2013 1115   CREATININE 1.01 01/05/2013 1600   CALCIUM 8.7 01/07/2013 1115   CALCIUM 9.2 01/05/2013 1600   GFRNONAA 82* 01/05/2013 1600   GFRAA >90 01/05/2013 1600   CMP     Component Value Date/Time   NA 140 01/07/2013 1115   K 4.5  01/07/2013 1115   CL 106 01/07/2013 1115   CO2 30 01/07/2013 1115   GLUCOSE 85 01/07/2013 1115   BUN 16 01/07/2013 1115   CREATININE 1.09 01/07/2013 1115   CREATININE 1.01 01/05/2013 1600   CALCIUM 8.7 01/07/2013 1115   PROT 6.2 01/07/2013 1115   ALBUMIN 3.8 01/07/2013 1115   AST 16 01/07/2013 1115   ALT 13 01/07/2013 1115   ALKPHOS 55 01/07/2013 1115   BILITOT 0.6 01/07/2013 1115   GFRNONAA 82* 01/05/2013 1600   GFRAA >90 01/05/2013 1600       Component Value Date/Time   WBC 7.5 01/05/2013 1600   HGB 13.8 01/05/2013 1600   HCT 41.6 01/05/2013 1600   MCV 89.3 01/05/2013 1600    Lipid Panel     Component Value Date/Time   CHOL 257* 01/07/2013 1115   TRIG 65 01/07/2013 1115   HDL 36* 01/07/2013 1115   CHOLHDL 7.1 01/07/2013 1115   VLDL 13 01/07/2013 1115   LDLCALC 208* 01/07/2013 1115    ABG No results found for this basename: phart, pco2, pco2art, po2, po2art, hco3, tco2, acidbasedef, o2sat  No results found for this basename: TSH   BNP (last 3 results) No results found for this basename: PROBNP,  in the last 8760 hours Cardiac Panel (last 3 results) No results found for this basename: CKTOTAL, CKMB, TROPONINI, RELINDX,  in the last 72 hours  Iron/TIBC/Ferritin No results found for this basename: iron, tibc, ferritin     EKG Orders placed in visit on 01/23/13  . EKG 12-LEAD     Prior Assessment and Plan Problem List as of 01/23/2013   Migraines   Accelerating angina   Last Assessment & Plan   01/07/2013 Office Visit Written 01/07/2013 12:54 PM by Jonelle Sidle, MD     Recent onset symptoms as outlined concerning for accelerating angina. ECG shows poor R wave progression anteriorly, recent cardiac markers were normal however. Principle risk factors include age and gender, history of hyperlipidemia. Followup FLP is pending at this time. We have discussed diagnostic possibilities, both noninvasive and invasive techniques, after reviewing the risks and benefits, plan is to proceed  with a diagnostic cardiac catheterization to most clearly evaluate his coronary anatomy and determine if any revascularization options need to be considered. Have asked him not to use Viagra in the short term, particularly not along with nitroglycerin. Continue aspirin. Check coagulation studies to complete recent bloodwork.    Abnormal ECG   Last Assessment & Plan   01/07/2013 Office Visit Written 01/07/2013 12:55 PM by Jonelle Sidle, MD     As noted above. Recent cardiac markers normal.    Hyperlipidemia   Last Assessment & Plan   01/07/2013 Office Visit Written 01/07/2013 12:54 PM by Jonelle Sidle, MD     Has not been on medical therapy, reports allergy to Lipitor previously. Followup FLP is pending.        Imaging: Dg Chest 2 View  01/05/2013   *RADIOLOGY REPORT*  Clinical Data: Chest pain and shortness of breath  CHEST - 2 VIEW  Comparison: None.  Findings:  Lungs are clear.  Heart size and pulmonary vascularity are normal.  No adenopathy.  No pneumothorax.  There is mild degenerative change in the thoracic spine.  IMPRESSION: No edema or consolidation.   Original Report Authenticated By: Bretta Bang, M.D.   Ct Angio Abdomen W/cm &/or Wo Contrast  01/05/2013   *RADIOLOGY REPORT*  Clinical Data:  Chest pain.  Aortic dissection.  Back pain. Hypotension.  CT ANGIOGRAPHY CHEST AND ABDOMEN  Technique:  Multidetector CT imaging of the chest and abdomen was performed using the standard protocol during bolus administration of intravenous contrast.  Multiplanar CT image reconstructions including MIPs were obtained to evaluate the vascular anatomy.  Contrast:  100 ml Omnipaque 350.  Comparison:   None.  CTA CHEST  Findings:  Noncontrast imaging the chest demonstrates aortic arch atherosclerosis.  There is no intramural hematoma.  After contrast administration, normal three-vessel aortic arch is identified.  There is no dissection or acute aortic abnormality. The heart appears within normal  limits allowing for motion artifact.  There is no pericardial or pleural effusion.  No axillary, mediastinal, or hilar adenopathy.  The lungs shows scattered areas of atelectasis.  There is no airspace disease. There are no aggressive osseous lesions.   Review of the MIP images confirms the above findings.  IMPRESSION: Negative CTA chest.  Negative for dissection.  CTA ABDOMEN  Findings:  Normal appearance of the liver.  Spleen appears within normal limits as well.  Gallbladder, common bile duct and pancreas appear within normal  limits.  Bilaterally the adrenal glands appear normal.  There is a left upper pole simple appearing renal cyst. Normal renal enhancement.  Mild respiratory motion artifact is present.  The abdominal aorta is within normal limits.  Single renal arteries are present bilaterally.  The superior mesenteric artery, celiac axis and nine and eight are all patent.  Both common iliac arteries appear normal.  There is no free air or acute intra-abdominal abnormality.   Review of the MIP images confirms the above findings.  IMPRESSION: Negative CTA of the abdomen.  Left renal cyst.   Original Report Authenticated By: Andreas Newport, M.D.   Ct Angio Chest Aortic Dissect W &/or W/o  01/05/2013   *RADIOLOGY REPORT*  Clinical Data:  Chest pain.  Aortic dissection.  Back pain. Hypotension.  CT ANGIOGRAPHY CHEST AND ABDOMEN  Technique:  Multidetector CT imaging of the chest and abdomen was performed using the standard protocol during bolus administration of intravenous contrast.  Multiplanar CT image reconstructions including MIPs were obtained to evaluate the vascular anatomy.  Contrast:  100 ml Omnipaque 350.  Comparison:   None.  CTA CHEST  Findings:  Noncontrast imaging the chest demonstrates aortic arch atherosclerosis.  There is no intramural hematoma.  After contrast administration, normal three-vessel aortic arch is identified.  There is no dissection or acute aortic abnormality. The heart appears  within normal limits allowing for motion artifact.  There is no pericardial or pleural effusion.  No axillary, mediastinal, or hilar adenopathy.  The lungs shows scattered areas of atelectasis.  There is no airspace disease. There are no aggressive osseous lesions.   Review of the MIP images confirms the above findings.  IMPRESSION: Negative CTA chest.  Negative for dissection.  CTA ABDOMEN  Findings:  Normal appearance of the liver.  Spleen appears within normal limits as well.  Gallbladder, common bile duct and pancreas appear within normal limits.  Bilaterally the adrenal glands appear normal.  There is a left upper pole simple appearing renal cyst. Normal renal enhancement.  Mild respiratory motion artifact is present.  The abdominal aorta is within normal limits.  Single renal arteries are present bilaterally.  The superior mesenteric artery, celiac axis and nine and eight are all patent.  Both common iliac arteries appear normal.  There is no free air or acute intra-abdominal abnormality.   Review of the MIP images confirms the above findings.  IMPRESSION: Negative CTA of the abdomen.  Left renal cyst.   Original Report Authenticated By: Andreas Newport, M.D.

## 2013-01-23 NOTE — Patient Instructions (Signed)
Your physician recommends that you schedule a follow-up appointment in:  

## 2013-01-23 NOTE — Progress Notes (Signed)
   HPI: Kenneth Moon is a 57 year old patient of Dr. Diona Browner we are seeing on followup after initial evaluation for chest pain on 01/07/2013. The dilation has no prior history of CAD but is also being treated for hyperlipidemia chronic migraine headaches. EKG showed poor R-wave progression and he had multiple cardiovascular risk factors. The patient was sent for diagnostic cardiac catheterization. This was completed by Dr. [place] 01/09/2013,demonstrating normal coronary anatomy and normal ejection fraction.    He comes today without further complaints. He is tolerating crestor 10 mg daily as prescribed by Dr. [place].    Allergies  Allergen Reactions  . Allegra (Fexofenadine Hcl)     Felt bad  . Lipitor (Atorvastatin)     Headache    Current Outpatient Prescriptions  Medication Sig Dispense Refill  . aspirin 81 MG tablet Take 81 mg by mouth daily.       No current facility-administered medications for this visit.    Past Medical History  Diagnosis Date  . Hyperlipidemia   . History of migraine headaches     Past Surgical History  Procedure Laterality Date  . Skull fracture elevation      Age 33    ZOX:WRUEAV of systems complete and found to be negative unless listed above  PHYSICAL EXAM BP 129/83  Pulse 75  Ht 5\' 6"  (1.676 m)  Wt 192 lb (87.091 kg)  BMI 31 kg/m2  General: Well developed, well nourished, in no acute distress Head: Eyes PERRLA, No xanthomas.   Normal cephalic and atramatic  Lungs: Clear bilaterally to auscultation and percussion. Heart: HRRR S1 S2, without MRG.  Pulses are 2+ & equal.            No carotid bruit. No JVD.  Abdomen: Bowel sounds are positive, abdomen soft and non-tender without masses or                  Hernia's noted. Msk:  Back normal, normal gait. Normal strength and tone for age. Extremities: No clubbing, cyanosis or edema.  DP +1 Neuro: Alert and oriented X 3. Psych:  Good affect, responds appropriately   ASSESSMENT AND PLAN

## 2013-01-23 NOTE — Assessment & Plan Note (Signed)
He is without cardiac complaint today. He is tolerating statin without myalgias. Reassurance is given. He will see [place] on prn basis.

## 2013-01-23 NOTE — Assessment & Plan Note (Signed)
Recently started on crestor 10 mg daily. He will follow up with PCP for ongoing testing and labs.

## 2013-04-22 ENCOUNTER — Other Ambulatory Visit: Payer: Self-pay | Admitting: Family Medicine

## 2013-07-28 ENCOUNTER — Ambulatory Visit (INDEPENDENT_AMBULATORY_CARE_PROVIDER_SITE_OTHER): Payer: 59 | Admitting: Family Medicine

## 2013-07-28 ENCOUNTER — Encounter: Payer: Self-pay | Admitting: Family Medicine

## 2013-07-28 VITALS — BP 118/78 | Ht 66.0 in | Wt 191.0 lb

## 2013-07-28 DIAGNOSIS — E785 Hyperlipidemia, unspecified: Secondary | ICD-10-CM

## 2013-07-28 DIAGNOSIS — M545 Low back pain, unspecified: Secondary | ICD-10-CM

## 2013-07-28 DIAGNOSIS — Z125 Encounter for screening for malignant neoplasm of prostate: Secondary | ICD-10-CM

## 2013-07-28 DIAGNOSIS — Z79899 Other long term (current) drug therapy: Secondary | ICD-10-CM

## 2013-07-28 MED ORDER — ROSUVASTATIN CALCIUM 10 MG PO TABS: ORAL_TABLET | ORAL | Status: DC

## 2013-07-28 NOTE — Progress Notes (Signed)
   Subjective:    Patient ID: Kenneth PontoElmo E Challis Jr., male    DOB: May 18, 1956, 58 y.o.   MRN: [id number]  HPIMed check up.   Lumbar pain for over 1 year. Hurts worse when bending. Radiates down left leg. The pain only radiates down his leg when he bends at the waist if he lifts properly or squats he does not get the pain. The pain is intermittent only with these above movements. Does not wake him up at night. No urination or bowel movement issues with it. Denies hematuria or hematochezia. Denies chest tightness pressure pain shortness breath nausea vomiting or fevers. He does state he takes his cholesterol medicine on regular basis he does try to watch how he eats does do some exercise. In addition to this he does try to get proper rest he denies any other particular problems.    Review of Systems See above.    Objective:   Physical Exam Neck no masses Lungs are clear no crackles Heart is regular no murmurs pulse normal BP good Abdomen soft no masses He has tight hamstrings but negative straight leg raise has some mild lower back tenderness to palpation. Fairly good range of motion. No weakness.       Assessment & Plan:  #1 hyperlipidemia continue Crestor. Patient did not tolerate other statins. May need to be on a higher dose. Check lab work await results.  #2 advise patient to contact his gastroenterologist he needs followup colonoscopy sheet was given to him.  #3 prostate exam normal PSA ordered await results  #4 low back pain discomfort with intermittent sciatica consistent with arthritic changes no findings of a herniated disc at this point followup if worse  25 minutes was spent in the evaluation of the back pain as well as hyperlipidemia and discussing the options  Recheck 6 months

## 2013-07-30 ENCOUNTER — Other Ambulatory Visit: Payer: Self-pay | Admitting: Family Medicine

## 2013-08-29 LAB — BASIC METABOLIC PANEL
BUN: 18 mg/dL (ref 6–23)
CALCIUM: 8.6 mg/dL (ref 8.4–10.5)
CHLORIDE: 111 meq/L (ref 96–112)
CO2: 24 meq/L (ref 19–32)
Creat: 0.99 mg/dL (ref 0.50–1.35)
GLUCOSE: 88 mg/dL (ref 70–99)
POTASSIUM: 3.9 meq/L (ref 3.5–5.3)
SODIUM: 142 meq/L (ref 135–145)

## 2013-08-29 LAB — HEPATIC FUNCTION PANEL
ALT: 15 U/L (ref 0–53)
AST: 21 U/L (ref 0–37)
Albumin: 3.6 g/dL (ref 3.5–5.2)
Alkaline Phosphatase: 62 U/L (ref 39–117)
BILIRUBIN DIRECT: 0.1 mg/dL (ref 0.0–0.3)
BILIRUBIN INDIRECT: 0.6 mg/dL (ref 0.2–1.2)
BILIRUBIN TOTAL: 0.7 mg/dL (ref 0.2–1.2)
Total Protein: 6.2 g/dL (ref 6.0–8.3)

## 2013-08-29 LAB — LIPID PANEL
CHOL/HDL RATIO: 4.1 ratio
CHOLESTEROL: 164 mg/dL (ref 0–200)
HDL: 40 mg/dL (ref >39)
LDL CALC: 111 mg/dL — AB (ref 0–99)
Triglycerides: 63 mg/dL (ref <150)
VLDL: 13 mg/dL (ref 0–40)

## 2013-08-30 LAB — PSA: PSA: 0.85 ng/mL (ref <=4.00)

## 2014-03-30 ENCOUNTER — Encounter: Payer: Self-pay | Admitting: Family Medicine

## 2014-03-30 ENCOUNTER — Ambulatory Visit (HOSPITAL_COMMUNITY)
Admission: RE | Admit: 2014-03-30 | Discharge: 2014-03-30 | Disposition: A | Discharge status: Home / Self Care | Payer: 59 | Source: Ambulatory Visit | Attending: Family Medicine | Admitting: Family Medicine

## 2014-03-30 ENCOUNTER — Ambulatory Visit (INDEPENDENT_AMBULATORY_CARE_PROVIDER_SITE_OTHER): Payer: 59 | Admitting: Family Medicine

## 2014-03-30 VITALS — BP 122/80 | Ht 66.0 in | Wt 192.5 lb

## 2014-03-30 DIAGNOSIS — M545 Low back pain: Secondary | ICD-10-CM | POA: Diagnosis present

## 2014-03-30 DIAGNOSIS — M5432 Sciatica, left side: Secondary | ICD-10-CM | POA: Insufficient documentation

## 2014-03-30 DIAGNOSIS — M5440 Lumbago with sciatica, unspecified side: Secondary | ICD-10-CM

## 2014-03-30 DIAGNOSIS — M5430 Sciatica, unspecified side: Secondary | ICD-10-CM

## 2014-03-30 DIAGNOSIS — E785 Hyperlipidemia, unspecified: Secondary | ICD-10-CM

## 2014-03-30 DIAGNOSIS — Z79899 Other long term (current) drug therapy: Secondary | ICD-10-CM

## 2014-03-30 NOTE — Progress Notes (Signed)
   Subjective:    Patient ID: Kenneth PontoElmo E Takayama Jr., male    DOB: Mar 13, 1956, 58 y.o.   MRN: [id number]  Back Pain This is a chronic problem. The current episode started more than 1 year ago. The problem occurs intermittently. The problem is unchanged. The pain is present in the lumbar spine. The quality of the pain is described as shooting. The pain radiates to the left thigh and left knee. The pain is moderate. The pain is the same all the time. The symptoms are aggravated by bending. Stiffness is present all day. He has tried NSAIDs for the symptoms. The treatment provided mild relief.   Patient states that he has no other concerns at this time.  Worse with bending over Pain severe Radiates into the left knee Present for years  Now worse over the past few months  Trying aleve- doesn't help Will last 20 to 30 minutes  Triggered by bending Car trip to work makes it worse  Although he has had back pain for multiple years and has had lumbar sacral x-rays in the past it has become progressively worse over the past several months. The pain is now radiating into his leg. It is severe when he tries to bend at the waist or if he tries to squat. He states even driving in the car for 20 minutes causes severe pain. He also states at times the pain is so severe that the leg feels weak. He has tried stretching tried anti-inflammatory nothing seems to help Review of Systems  Musculoskeletal: Positive for back pain.   relates pain down the leg relates some burning pain discomfort numbness denies weakness except when pain is severe     Objective:   Physical Exam  Positive straight leg raise on the left strength is good low back moderate tenderness lungs clear heart regular abdomen soft      Assessment & Plan:  Significant sciatica low back pain stretching exercises shown anti-inflammatories this is progressive over the past several years worse over the past few months he needs to have plain x-rays of low  back followed by MRI may need referral depending on the results of this to orthopedics or neurosurgery see discussion above  He does need lab work to look at his cholesterol probably will need followup on that within 3 months

## 2014-04-02 NOTE — Progress Notes (Signed)
Patient notified and verbalized understanding of test results. MRI scheduled for Wed. Oct 21 at 3:45pm (Patient needs to be notified of MRI date/time) [facility]

## 2014-04-02 NOTE — Addendum Note (Signed)
Addended byOneal Deputy: Safaa Stingley D on: 04/02/2014 03:24 PM   Modules accepted: Orders

## 2014-04-02 NOTE — Progress Notes (Signed)
Patient notified and verbalized understanding of test results. MRI scheduled for Wed. Oct 21 at 3:45

## 2014-04-04 ENCOUNTER — Encounter: Payer: Self-pay | Admitting: Family Medicine

## 2014-04-04 LAB — HEPATIC FUNCTION PANEL
ALBUMIN: 3.9 g/dL (ref 3.5–5.2)
ALT: 18 U/L (ref 0–53)
AST: 23 U/L (ref 0–37)
Alkaline Phosphatase: 58 U/L (ref 39–117)
BILIRUBIN DIRECT: 0.1 mg/dL (ref 0.0–0.3)
BILIRUBIN TOTAL: 0.4 mg/dL (ref 0.2–1.2)
Indirect Bilirubin: 0.3 mg/dL (ref 0.2–1.2)
Total Protein: 6.3 g/dL (ref 6.0–8.3)

## 2014-04-04 LAB — LIPID PANEL
CHOL/HDL RATIO: 4 ratio
Cholesterol: 176 mg/dL (ref 0–200)
HDL: 44 mg/dL (ref >39)
LDL CALC: 119 mg/dL — AB (ref 0–99)
Triglycerides: 65 mg/dL (ref <150)
VLDL: 13 mg/dL (ref 0–40)

## 2014-04-05 MED ORDER — ROSUVASTATIN CALCIUM 20 MG PO TABS: 20.0000 mg | ORAL_TABLET | Freq: Every day | ORAL | Status: DC

## 2014-04-05 NOTE — Addendum Note (Signed)
Addended by: Margaretha SheffieldBROWN, AUTUMN S on: 04/05/2014 10:36 AM   Modules accepted: Orders

## 2014-04-07 ENCOUNTER — Ambulatory Visit (HOSPITAL_COMMUNITY): Payer: 59

## 2014-04-13 ENCOUNTER — Ambulatory Visit (HOSPITAL_COMMUNITY)
Admission: RE | Admit: 2014-04-13 | Discharge: 2014-04-13 | Disposition: A | Discharge status: Home / Self Care | Payer: 59 | Source: Ambulatory Visit | Attending: Family Medicine | Admitting: Family Medicine

## 2014-04-13 DIAGNOSIS — M47896 Other spondylosis, lumbar region: Secondary | ICD-10-CM | POA: Diagnosis not present

## 2014-04-13 DIAGNOSIS — M5440 Lumbago with sciatica, unspecified side: Secondary | ICD-10-CM

## 2014-04-13 DIAGNOSIS — M4806 Spinal stenosis, lumbar region: Secondary | ICD-10-CM | POA: Insufficient documentation

## 2014-04-13 DIAGNOSIS — M545 Low back pain: Secondary | ICD-10-CM | POA: Diagnosis present

## 2014-04-14 ENCOUNTER — Other Ambulatory Visit: Payer: Self-pay

## 2014-04-14 DIAGNOSIS — M5440 Lumbago with sciatica, unspecified side: Secondary | ICD-10-CM

## 2014-06-18 HISTORY — PX: BACK SURGERY: SHX140

## 2014-07-14 ENCOUNTER — Other Ambulatory Visit (HOSPITAL_COMMUNITY): Payer: Self-pay | Admitting: Neurosurgery

## 2014-07-14 ENCOUNTER — Ambulatory Visit (HOSPITAL_COMMUNITY)
Admission: RE | Admit: 2014-07-14 | Discharge: 2014-07-14 | Disposition: A | Discharge status: Home / Self Care | Payer: 59 | Source: Ambulatory Visit | Attending: Neurosurgery | Admitting: Neurosurgery

## 2014-07-14 DIAGNOSIS — M545 Low back pain: Secondary | ICD-10-CM | POA: Diagnosis present

## 2014-07-14 DIAGNOSIS — M47816 Spondylosis without myelopathy or radiculopathy, lumbar region: Secondary | ICD-10-CM

## 2014-07-14 DIAGNOSIS — M47896 Other spondylosis, lumbar region: Secondary | ICD-10-CM | POA: Diagnosis not present

## 2014-09-06 ENCOUNTER — Other Ambulatory Visit: Payer: Self-pay | Admitting: Family Medicine

## 2014-10-14 ENCOUNTER — Other Ambulatory Visit: Payer: Self-pay | Admitting: Family Medicine

## 2014-11-09 ENCOUNTER — Other Ambulatory Visit: Payer: Self-pay | Admitting: Family Medicine

## 2015-01-13 ENCOUNTER — Emergency Department (HOSPITAL_COMMUNITY)
Admission: EM | Admit: 2015-01-13 | Discharge: 2015-01-13 | Disposition: A | Discharge status: Home / Self Care | Payer: Commercial Managed Care - HMO | Attending: Emergency Medicine | Admitting: Emergency Medicine

## 2015-01-13 ENCOUNTER — Emergency Department (HOSPITAL_COMMUNITY): Payer: Commercial Managed Care - HMO

## 2015-01-13 ENCOUNTER — Encounter (HOSPITAL_COMMUNITY): Payer: Self-pay

## 2015-01-13 DIAGNOSIS — R1111 Vomiting without nausea: Secondary | ICD-10-CM | POA: Diagnosis not present

## 2015-01-13 DIAGNOSIS — R519 Headache, unspecified: Secondary | ICD-10-CM

## 2015-01-13 DIAGNOSIS — H538 Other visual disturbances: Secondary | ICD-10-CM | POA: Diagnosis present

## 2015-01-13 DIAGNOSIS — E785 Hyperlipidemia, unspecified: Secondary | ICD-10-CM | POA: Insufficient documentation

## 2015-01-13 DIAGNOSIS — R51 Headache: Secondary | ICD-10-CM | POA: Diagnosis not present

## 2015-01-13 LAB — BASIC METABOLIC PANEL
Anion gap: 7 (ref 5–15)
BUN: 19 mg/dL (ref 6–20)
CALCIUM: 8.6 mg/dL — AB (ref 8.9–10.3)
CO2: 25 mmol/L (ref 22–32)
Chloride: 106 mmol/L (ref 101–111)
Creatinine, Ser: 0.94 mg/dL (ref 0.61–1.24)
GFR calc non Af Amer: >60 mL/min (ref >60)
GLUCOSE: 107 mg/dL — AB (ref 65–99)
Potassium: 4 mmol/L (ref 3.5–5.1)
SODIUM: 138 mmol/L (ref 135–145)

## 2015-01-13 MED ORDER — DIPHENHYDRAMINE HCL 50 MG/ML IJ SOLN
25.0000 mg | Freq: Once | INTRAMUSCULAR | Status: AC
  Administered 2015-01-13: 25 mg via INTRAVENOUS
  Filled 2015-01-13: qty 1

## 2015-01-13 MED ORDER — PROCHLORPERAZINE EDISYLATE 5 MG/ML IJ SOLN
10.0000 mg | Freq: Four times a day (QID) | INTRAMUSCULAR | Status: DC | PRN
  Administered 2015-01-13: 10 mg via INTRAVENOUS
  Filled 2015-01-13: qty 2

## 2015-01-13 MED ORDER — SODIUM CHLORIDE 0.9 % IV BOLUS (SEPSIS)
500.0000 mL | Freq: Once | INTRAVENOUS | Status: AC
  Administered 2015-01-13: 500 mL via INTRAVENOUS

## 2015-01-13 MED ORDER — IOHEXOL 350 MG/ML SOLN
100.0000 mL | Freq: Once | INTRAVENOUS | Status: AC | PRN
  Administered 2015-01-13: 100 mL via INTRAVENOUS

## 2015-01-13 NOTE — ED Notes (Signed)
Bed: WA04 Expected date:  Expected time:  Means of arrival:  Comments: 59 yo M, sudden onset headache, blurred vision

## 2015-01-13 NOTE — ED Provider Notes (Signed)
CSN: [id number]     Arrival date & time 01/13/15  4098 History   First MD Initiated Contact with Patient 01/13/15 [phone]     Chief Complaint  Patient presents with  . Blurred Vision     (Consider location/radiation/quality/duration/timing/severity/associated sxs/prior Treatment) Patient is a 59 y.o. male presenting with eye problem and headaches.  Eye Problem Location:  Both Quality:  Aching (blurred vision) Severity:  Moderate Onset quality:  Sudden Duration:  5 minutes Timing:  Constant Progression:  Improving Associated symptoms: blurred vision (began on the way to work, progressively got worse during meeting with sudden onset headache, then headache  improved, then blurred vision resolved), headaches, nausea and tearing   Associated symptoms: no numbness, no vomiting and no weakness   Associated symptoms comment:  Anxiety surrounding headache, shaking all over Headache Pain location:  Frontal Radiates to:  Eyes and face Severity currently:  5/10 Severity at highest:  10/10 Onset quality:  Sudden Duration: 5 minutes. Timing:  Constant Progression:  Improving Chronicity:  New Similar to prior headaches: no   Relieved by:  Resting in a darkened room (layed down) Worsened by:  Nothing Ineffective treatments:  None tried Associated symptoms: blurred vision (began on the way to work, progressively got worse during meeting with sudden onset headache, then headache  improved, then blurred vision resolved) and nausea   Associated symptoms: no abdominal pain, no back pain, no fever, no neck stiffness, no numbness, no sore throat, no vomiting and no weakness     Past Medical History  Diagnosis Date  . Hyperlipidemia   . History of migraine headaches    Past Surgical History  Procedure Laterality Date  . Skull fracture elevation      Age 24   Family History  Problem Relation Age of Onset  . Cancer Mother   . Cancer Father   . CAD Father     Not premature   History   Substance Use Topics  . Smoking status: Never Smoker   . Smokeless tobacco: Not on file  . Alcohol Use: No    Review of Systems  Constitutional: Negative for fever.  HENT: Negative for sore throat.   Eyes: Positive for blurred vision (began on the way to work, progressively got worse during meeting with sudden onset headache, then headache  improved, then blurred vision resolved) and visual disturbance (initial, resolved prior to coming to my hx/exam in ED).  Respiratory: Negative for shortness of breath.   Cardiovascular: Negative for chest pain.  Gastrointestinal: Positive for nausea. Negative for vomiting and abdominal pain.  Genitourinary: Negative for difficulty urinating.  Musculoskeletal: Negative for back pain and neck stiffness.  Skin: Negative for rash.  Neurological: Positive for headaches. Negative for tremors, syncope, facial asymmetry, speech difficulty, weakness and numbness.      Allergies  Allegra and Lipitor  Home Medications   Prior to Admission medications   Medication Sig Start Date End Date Taking? Authorizing Provider  CRESTOR 20 MG tablet take 1 tablet by mouth once daily Patient not taking: Reported on 01/13/2015 11/10/14   Merlyn Albert, MD   BP 121/76 mmHg  Pulse 86  Temp(Src) 97.7 F (36.5 C) (Oral)  Resp 18  SpO2 100% Physical Exam  Constitutional: He is oriented to person, place, and time. He appears well-developed and well-nourished. No distress.  HENT:  Head: Normocephalic and atraumatic.  Eyes: Conjunctivae are normal. Right eye exhibits normal extraocular motion. Left eye exhibits normal extraocular motion. Right pupil is round and  reactive. Left pupil is round and reactive. Pupils are equal.  Normal peripheral vision  Neck: Normal range of motion.  Cardiovascular: Normal rate, regular rhythm, normal heart sounds and intact distal pulses.  Exam reveals no gallop and no friction rub.   No murmur heard. Pulmonary/Chest: Effort normal  and breath sounds normal. No respiratory distress. He has no wheezes. He has no rales.  Abdominal: Soft. He exhibits no distension. There is no tenderness. There is no guarding.  Musculoskeletal: He exhibits no edema.  Neurological: He is alert and oriented to person, place, and time. He has normal strength. He displays no tremor. No cranial nerve deficit or sensory deficit. Coordination and gait normal. GCS eye subscore is 4. GCS verbal subscore is 5. GCS motor subscore is 6.  Skin: Skin is warm and dry. He is not diaphoretic.  Nursing note and vitals reviewed.   ED Course  Procedures (including critical care time) Labs Review Labs Reviewed  BASIC METABOLIC PANEL - Abnormal; Notable for the following:    Glucose, Bld 107 (*)    Calcium 8.6 (*)    All other components within normal limits    Imaging Review Ct Angio Head W/cm &/or Wo Cm  01/13/2015   CLINICAL DATA:  Patient had sudden onset of headache and blurred vision earlier today. Headache resolved but blurred vision remains. No slurred speech. History of migraine.  EXAM: CT ANGIOGRAPHY HEAD  TECHNIQUE: Multidetector CT imaging of the head was performed using the standard protocol during bolus administration of intravenous contrast. Multiplanar CT image reconstructions and MIPs were obtained to evaluate the vascular anatomy.  CONTRAST:  OMNIPAQUE IOHEXOL 350 MG/ML SOLN  COMPARISON:  None.  FINDINGS: CT HEAD  Calvarium and skull base: No fracture or destructive lesion. Mastoids and middle ears are clear.  Paranasal sinuses: Imaged portions are clear.  Orbits: Negative.  Brain: No evidence of acute abnormality, including acute infarct, hemorrhage, hydrocephalus, or mass lesion.  CTA HEAD  Anterior circulation: No significant stenosis, proximal occlusion, aneurysm, or vascular malformation.  Posterior circulation: No significant stenosis, proximal occlusion, aneurysm, or vascular malformation.  Venous sinuses: As permitted by contrast  timing, patent.  Anatomic variants: None of significance.  Delayed phase:   No abnormal intracranial enhancement.  IMPRESSION: Negative.   Electronically Signed   By: Elsie Stain M.D.   On: 01/13/2015 11:48     EKG Interpretation None      MDM   Final diagnoses:  Headache   59 year old male with a history of migraines and hyperlipidemia presents with concern of sudden onset severe headache with blurred vision.  Given patient describing sudden onset severe worst headache of his life a CT head and CTA was ordered to evaluate for subarachnoid hemorrhage or aneurysm and was negative.   Discussed with patient that lumbar puncture is the standard for evaluating for [facility] in the setting of a negative CT, however findings of negative CT within hours of onset and negative CTA are reassuring and using shared decision making, will not proceed with lumbar puncture at this time. Patient was given compazine and benadryl with improvement in headache.  Patient had a normal neurologic exam, no other neurologic symptoms on history, no blurred vision on evaluation in the emergency department and have low suspicion for TIA or stroke.    Patient discharged in stable condition with understanding of reasons to return and recommend close PCP follow up for symptoms.    Alvira Monday, MD 01/13/15 2107

## 2015-01-13 NOTE — ED Notes (Signed)
Per EMS, pt from work.  Pt sitting in meeting at work.  Had sudden onset of headache and blurred vision. Attempted to lie flat with no help.  Headache has resolved but blurred vision remains.  No neuro deficits.  No slurred speech.  BP elevated on arrival but WNL on arrival to ED.  20 g in Rt hand. No prior incident of same.  Has hx of migraines but states does not feel the same.  Started at 8:30 am.  Vitals:  cbg 84, bp 180/108, decrease to 134/96, hr 77, 99% ra.

## 2015-03-01 ENCOUNTER — Encounter: Payer: Self-pay | Admitting: Family Medicine

## 2015-03-24 ENCOUNTER — Encounter: Payer: Self-pay | Admitting: Family Medicine

## 2015-03-24 ENCOUNTER — Ambulatory Visit (INDEPENDENT_AMBULATORY_CARE_PROVIDER_SITE_OTHER): Payer: Commercial Managed Care - HMO | Admitting: Family Medicine

## 2015-03-24 VITALS — BP 122/82 | Ht 66.0 in | Wt 209.2 lb

## 2015-03-24 DIAGNOSIS — Z125 Encounter for screening for malignant neoplasm of prostate: Secondary | ICD-10-CM | POA: Diagnosis not present

## 2015-03-24 DIAGNOSIS — T148 Other injury of unspecified body region: Secondary | ICD-10-CM | POA: Diagnosis not present

## 2015-03-24 DIAGNOSIS — Z79899 Other long term (current) drug therapy: Secondary | ICD-10-CM

## 2015-03-24 DIAGNOSIS — E785 Hyperlipidemia, unspecified: Secondary | ICD-10-CM

## 2015-03-24 DIAGNOSIS — Z0000 Encounter for general adult medical examination without abnormal findings: Secondary | ICD-10-CM | POA: Diagnosis not present

## 2015-03-24 DIAGNOSIS — Z23 Encounter for immunization: Secondary | ICD-10-CM

## 2015-03-24 DIAGNOSIS — T148XXA Other injury of unspecified body region, initial encounter: Secondary | ICD-10-CM

## 2015-03-24 NOTE — Patient Instructions (Addendum)
Dear Patient,  It has been recommended to you that you have a colonoscopy. It is your responsibility to carry through with this recommendation.   Did you realize that colon cancer is the second leading cancer killer in the [place]. One in every 20 adults will get colon cancer. If all adults would go through the recommended screening for colon cancer (getting a colonoscopy), then there would be a 60% reduction in the number of people dying from colon cancer.  Colon cancer just doesn't come out of the blue. It starts off as a small polyp which over time grows into a cancer. A colonoscopy can prevent cancer and in many cases detected when it is at a very treatable phase. Small colon cancers can have cure rates of 95%. Advanced colon cancer, which often occurs in people who do not do their screenings, have cure rates less than 20%. The risk of colon cancer advances with age. Most adults should have regular colonoscopies every 10 years starting at age 42. This recommendation can vary depending on a person's medical history.  Health-care laws now allow for you to call the gastroenterologist office directly in order to set yourself up for this very important tests. Today we have recommended to you that you do this test. This test may save your life. Failure to do this test puts you at risk for premature death from colon cancer. Do the right thing and schedule this test now.  Here as a list of specialists we recommend in the surrounding area. When you call their office let them know that you are a patient of our practice in your interested in doing a screening colonoscopy. They should assist you without problems. You will need the following information when you called them: 1-name of which Dr. you see, 2-your insurance information, 3-a list of medications that you currently take, 4-any allergies you have to medications.  [place] gastroenterologist Dr. Milton Ferguson, Dr Felicie Morn  gastroenterologist   [facility]   [phone]  [facility] gastroenterology (Dr. Garnetta Buddy and [place]) [phone]  [facility] gastroenterology (Dr. Leia Alf, Harrietta Guardian, [place]) [phone]  Each group of specialists has assured [place] that when you called them they will help you get your colonoscopy set up. Should you have problems or if the GI practice insist a referral be done please let us know. Be sure to call soon. Sincerely, Kenneth Moon, Dr Mickie Hillier, Dr.Plez Belton   Aspirin and Your Heart  Aspirin is a medicine that affects the way blood clots. Aspirin can be used to help reduce the risk of blood clots, heart attacks, and other heart-related problems.  SHOULD I TAKE ASPIRIN? Your health care provider will help you determine whether it is safe and beneficial for you to take aspirin daily. Taking aspirin daily may be beneficial if you:  Have had a heart attack or chest pain.  Have undergone open heart surgery such as coronary artery bypass surgery (CABG).  Have had coronary angioplasty.  Have experienced a stroke or transient ischemic attack (TIA).  Have peripheral vascular disease (PVD).  Have chronic heart rhythm problems such as atrial fibrillation. ARE THERE ANY RISKS OF TAKING ASPIRIN DAILY? Daily use of aspirin can increase your risk of side effects. Some of these include:  Bleeding. Bleeding problems can be minor or serious. An example of a minor problem is a cut that does not stop bleeding. An example of  a more serious problem is stomach bleeding or bleeding into the brain. Your risk of bleeding is increased if you are also taking non-steroidal anti-inflammatory medicine (NSAIDs).  Increased bruising.  Upset stomach.  An allergic reaction. People who have nasal polyps have an increased risk of developing an aspirin allergy. WHAT ARE SOME  GUIDELINES I SHOULD FOLLOW WHEN TAKING ASPIRIN?   Take aspirin only as directed by your health care provider. Make sure you understand how much you should take and what form you should take. The two forms of aspirin are:  Non-enteric-coated. This type of aspirin does not have a coating and is absorbed quickly. Non-enteric-coated aspirin is usually recommended for people with chest pain. This type of aspirin also comes in a chewable form.  Enteric-coated. This type of aspirin has a special coating that releases the medicine very slowly. Enteric-coated aspirin causes less stomach upset than non-enteric-coated aspirin. This type of aspirin should not be chewed or crushed.  Drink alcohol in moderation. Drinking alcohol increases your risk of bleeding. WHEN SHOULD I SEEK MEDICAL CARE?   You have unusual bleeding or bruising.  You have stomach pain.  You have an allergic reaction. Symptoms of an allergic reaction include:  Hives.  Itchy skin.  Swelling of the lips, tongue, or face.  You have ringing in your ears. WHEN SHOULD I SEEK IMMEDIATE MEDICAL CARE?   Your bowel movements are bloody, dark red, or black in color.  You vomit or cough up blood.  You have blood in your urine.  You cough, wheeze, or feel short of breath. If you have any of the following symptoms, this is an emergency. Do not wait to see if the pain will go away. Get medical help at once. Call your local emergency services (911 in the [place] Do not drive yourself to the hospital.  You have severe chest pain, especially if the pain is crushing or pressure-like and spreads to the arms, back, neck, or jaw.  You have stroke-like symptoms, such as:   Loss of vision.   Difficulty talking.   Numbness or weakness on one side of your body.   Numbness or weakness in your arm or leg.   Not thinking clearly or feeling confused.    This information is not intended to replace advice given to you by your health  care provider. Make sure you discuss any questions you have with your health care provider.   Document Released: 05/17/2008 Document Revised: 06/25/2014 Document Reviewed: 09/09/2013 Elsevier Interactive Patient Education 2016 Elsevier Inc.   DASH Eating Plan DASH stands for "Dietary Approaches to Stop Hypertension." The DASH eating plan is a healthy eating plan that has been shown to reduce high blood pressure (hypertension). Additional health benefits may include reducing the risk of type 2 diabetes mellitus, heart disease, and stroke. The DASH eating plan may also help with weight loss. WHAT DO I NEED TO KNOW ABOUT THE DASH EATING PLAN? For the DASH eating plan, you will follow these general guidelines:  Choose foods with a percent daily value for sodium of less than 5% (as listed on the food label).  Use salt-free seasonings or herbs instead of table salt or sea salt.  Check with your health care provider or pharmacist before using salt substitutes.  Eat lower-sodium products, often labeled as "lower sodium" or "no salt added."  Eat fresh foods.  Eat more vegetables, fruits, and low-fat dairy products.  Choose whole grains. Look for the word "whole" as the first word  in the ingredient list.  Choose fish and skinless chicken or [place] more often than red meat. Limit fish, poultry, and meat to 6 oz (170 g) each day.  Limit sweets, desserts, sugars, and sugary drinks.  Choose heart-healthy fats.  Limit cheese to 1 oz (28 g) per day.  Eat more home-cooked food and less restaurant, buffet, and fast food.  Limit fried foods.  Cook foods using methods other than frying.  Limit canned vegetables. If you do use them, rinse them well to decrease the sodium.  When eating at a restaurant, ask that your food be prepared with less salt, or no salt if possible. WHAT FOODS CAN I EAT? Seek help from a dietitian for individual calorie needs. Grains Whole grain or whole wheat bread.  Brown rice. Whole grain or whole wheat pasta. Quinoa, bulgur, and whole grain cereals. Low-sodium cereals. Corn or whole wheat flour tortillas. Whole grain cornbread. Whole grain crackers. Low-sodium crackers. Vegetables Fresh or frozen vegetables (raw, steamed, roasted, or grilled). Low-sodium or reduced-sodium tomato and vegetable juices. Low-sodium or reduced-sodium tomato sauce and paste. Low-sodium or reduced-sodium canned vegetables.  Fruits All fresh, canned (in natural juice), or frozen fruits. Meat and Other Protein Products Ground beef (85% or leaner), grass-fed beef, or beef trimmed of fat. Skinless chicken or [place]. Ground chicken or [place]. Pork trimmed of fat. All fish and seafood. Eggs. Dried beans, peas, or lentils. Unsalted nuts and seeds. Unsalted canned beans. Dairy Low-fat dairy products, such as skim or 1% milk, 2% or reduced-fat cheeses, low-fat ricotta or cottage cheese, or plain low-fat yogurt. Low-sodium or reduced-sodium cheeses. Fats and Oils Tub margarines without trans fats. Light or reduced-fat mayonnaise and salad dressings (reduced sodium). Avocado. Safflower, olive, or canola oils. Natural peanut or almond butter. Other Unsalted popcorn and pretzels. The items listed above may not be a complete list of recommended foods or beverages. Contact your dietitian for more options. WHAT FOODS ARE NOT RECOMMENDED? Grains White bread. White pasta. White rice. Refined cornbread. Bagels and croissants. Crackers that contain trans fat. Vegetables Creamed or fried vegetables. Vegetables in a cheese sauce. Regular canned vegetables. Regular canned tomato sauce and paste. Regular tomato and vegetable juices. Fruits Dried fruits. Canned fruit in light or heavy syrup. Fruit juice. Meat and Other Protein Products Fatty cuts of meat. Ribs, chicken wings, bacon, sausage, bologna, salami, chitterlings, fatback, hot dogs, bratwurst, and packaged luncheon meats. Salted nuts and seeds.  Canned beans with salt. Dairy Whole or 2% milk, cream, half-and-half, and cream cheese. Whole-fat or sweetened yogurt. Full-fat cheeses or blue cheese. Nondairy creamers and whipped toppings. Processed cheese, cheese spreads, or cheese curds. Condiments Onion and garlic salt, seasoned salt, table salt, and sea salt. Canned and packaged gravies. Worcestershire sauce. Tartar sauce. Barbecue sauce. Teriyaki sauce. Soy sauce, including reduced sodium. Steak sauce. Fish sauce. Oyster sauce. Cocktail sauce. Horseradish. Ketchup and mustard. Meat flavorings and tenderizers. Bouillon cubes. Hot sauce. Tabasco sauce. Marinades. Taco seasonings. Relishes. Fats and Oils Butter, stick margarine, lard, shortening, ghee, and bacon fat. Coconut, palm kernel, or palm oils. Regular salad dressings. Other Pickles and olives. Salted popcorn and pretzels. The items listed above may not be a complete list of foods and beverages to avoid. Contact your dietitian for more information. WHERE CAN I FIND MORE INFORMATION? National Heart, Lung, and Blood Institute: [employer]   This information is not intended to replace advice given to you by your health care provider. Make sure you discuss any questions you have with your  health care provider.   Document Released: 05/24/2011 Document Revised: 06/25/2014 Document Reviewed: 04/08/2013 Elsevier Interactive Patient Education [employer]

## 2015-03-24 NOTE — Progress Notes (Signed)
   Subjective:    Patient ID: Kenneth Ponto., male    DOB: Oct 21, 1955, 59 y.o.   MRN: [id number]  HPI The patient comes in today for a wellness visit. The focus of today's visit was wellness. Patient states he is not taking cholesterol medicine for the past several months he is just doing the best he can and watching his diet is interested in seeing what it is   A review of their health history was completed.  A review of medications was also completed.  Any needed refills; yes  Eating habits: eating all time   Falls/  MVA accidents in past few months: none  Regular exercise: not much  Specialist pt sees on regular basis: no  Preventative health issues were discussed.   Additional concerns: no    Review of Systems  Constitutional: Negative for fever, activity change and appetite change.  HENT: Negative for congestion and rhinorrhea.   Eyes: Negative for discharge.  Respiratory: Negative for cough and wheezing.   Cardiovascular: Negative for chest pain.  Gastrointestinal: Negative for vomiting, abdominal pain and blood in stool.  Genitourinary: Negative for frequency and difficulty urinating.  Musculoskeletal: Negative for neck pain.  Skin: Negative for rash.  Allergic/Immunologic: Negative for environmental allergies and food allergies.  Neurological: Negative for weakness and headaches.  Psychiatric/Behavioral: Negative for agitation.       Objective:   Physical Exam  Constitutional: He appears well-developed and well-nourished.  HENT:  Head: Normocephalic and atraumatic.  Right Ear: External ear normal.  Left Ear: External ear normal.  Nose: Nose normal.  Mouth/Throat: Oropharynx is clear and moist.  Eyes: Right eye exhibits no discharge. Left eye exhibits no discharge.  Neck: Normal range of motion. Neck supple. No thyromegaly present.  Cardiovascular: Normal rate, regular rhythm and normal heart sounds.   No murmur heard. Pulmonary/Chest: Effort normal  and breath sounds normal. No respiratory distress. He has no wheezes.  Abdominal: Soft. Bowel sounds are normal. He exhibits no distension and no mass. There is no tenderness.  Genitourinary: Rectum normal, prostate normal and penis normal.  Musculoskeletal: Normal range of motion. He exhibits no edema.  Lymphadenopathy:    He has no cervical adenopathy.  Neurological: He is alert. He exhibits normal muscle tone.  Skin: Skin is warm and dry. No erythema.  Psychiatric: He has a normal mood and affect. His behavior is normal. Judgment normal.    Patient does have small abrasion on the right arm from a couple days ago tetanus shot to be given today no sign of infection      Assessment & Plan:  Wellness-safety dietary discussed. Patient gets flu shot at work. Will check lab work. May need to be back on cholesterol medicine. Regular exercise and healthy diet discussed. Recommended 81 mg aspirin. Colonoscopy recommended information given he will call us if he needs referral. Follow-up in one year. Await lab work.

## 2015-03-28 LAB — HEPATIC FUNCTION PANEL
ALBUMIN: 3.9 g/dL (ref 3.5–5.5)
ALT: 16 IU/L (ref 0–44)
AST: 17 IU/L (ref 0–40)
Alkaline Phosphatase: 71 IU/L (ref 39–117)
BILIRUBIN TOTAL: 0.3 mg/dL (ref 0.0–1.2)
BILIRUBIN, DIRECT: 0.07 mg/dL (ref 0.00–0.40)
TOTAL PROTEIN: 6.4 g/dL (ref 6.0–8.5)

## 2015-03-28 LAB — BASIC METABOLIC PANEL
BUN / CREAT RATIO: 15 (ref 9–20)
BUN: 14 mg/dL (ref 6–24)
CHLORIDE: 105 mmol/L (ref 97–108)
CO2: 22 mmol/L (ref 18–29)
Calcium: 8.8 mg/dL (ref 8.7–10.2)
Creatinine, Ser: 0.94 mg/dL (ref 0.76–1.27)
GFR calc non Af Amer: 90 mL/min/{1.73_m2} (ref >59)
GFR, EST AFRICAN AMERICAN: 104 mL/min/{1.73_m2} (ref >59)
Glucose: 97 mg/dL (ref 65–99)
POTASSIUM: 4.3 mmol/L (ref 3.5–5.2)
Sodium: 142 mmol/L (ref 134–144)

## 2015-03-28 LAB — CBC WITH DIFFERENTIAL/PLATELET
Basophils Absolute: 0 10*3/uL (ref 0.0–0.2)
Basos: 0 %
EOS (ABSOLUTE): 0.1 10*3/uL (ref 0.0–0.4)
Eos: 1 %
HEMOGLOBIN: 13.4 g/dL (ref 12.6–17.7)
Hematocrit: 38.7 % (ref 37.5–51.0)
IMMATURE GRANS (ABS): 0 10*3/uL (ref 0.0–0.1)
Immature Granulocytes: 0 %
Lymphocytes Absolute: 1.2 10*3/uL (ref 0.7–3.1)
Lymphs: 20 %
MCH: 30.8 pg (ref 26.6–33.0)
MCHC: 34.6 g/dL (ref 31.5–35.7)
MCV: 89 fL (ref 79–97)
MONOCYTES: 8 %
Monocytes Absolute: 0.5 10*3/uL (ref 0.1–0.9)
NEUTROS ABS: 4.2 10*3/uL (ref 1.4–7.0)
Neutrophils: 71 %
Platelets: 225 10*3/uL (ref 150–379)
RBC: 4.35 x10E6/uL (ref 4.14–5.80)
RDW: 13.8 % (ref 12.3–15.4)
WBC: 5.9 10*3/uL (ref 3.4–10.8)

## 2015-03-28 LAB — LIPID PANEL
CHOL/HDL RATIO: 7.4 ratio — AB (ref 0.0–5.0)
Cholesterol, Total: 275 mg/dL — ABNORMAL HIGH (ref 100–199)
HDL: 37 mg/dL — ABNORMAL LOW (ref >39)
LDL Calculated: 204 mg/dL — ABNORMAL HIGH (ref 0–99)
Triglycerides: 168 mg/dL — ABNORMAL HIGH (ref 0–149)
VLDL Cholesterol Cal: 34 mg/dL (ref 5–40)

## 2015-03-28 LAB — PSA: Prostate Specific Ag, Serum: 0.8 ng/mL (ref 0.0–4.0)

## 2015-03-31 MED ORDER — ROSUVASTATIN CALCIUM 20 MG PO TABS: 20.0000 mg | ORAL_TABLET | Freq: Every day | ORAL | Status: DC

## 2015-03-31 NOTE — Addendum Note (Signed)
Addended by: Jeralene PetersREWS, Tondalaya Perren R on: 03/31/2015 08:38 AM   Modules accepted: Orders

## 2015-10-18 ENCOUNTER — Ambulatory Visit (INDEPENDENT_AMBULATORY_CARE_PROVIDER_SITE_OTHER): Payer: Commercial Managed Care - HMO | Admitting: Family Medicine

## 2015-10-18 ENCOUNTER — Encounter: Payer: Self-pay | Admitting: Family Medicine

## 2015-10-18 VITALS — BP 132/80 | Temp 98.2°F | Ht 66.0 in | Wt 207.1 lb

## 2015-10-18 DIAGNOSIS — H6981 Other specified disorders of Eustachian tube, right ear: Secondary | ICD-10-CM

## 2015-10-18 DIAGNOSIS — S46111D Strain of muscle, fascia and tendon of long head of biceps, right arm, subsequent encounter: Secondary | ICD-10-CM

## 2015-10-18 MED ORDER — FLUTICASONE PROPIONATE 50 MCG/ACT NA SUSP: 2.0000 | Freq: Every day | NASAL | Status: DC

## 2015-10-18 NOTE — Progress Notes (Signed)
   Subjective:    Patient ID: Gar PontoElmo E Brinley Jr., male    DOB: 11/22/1955, 60 y.o.   MRN: [id number]  Otalgia  There is pain in both ears. This is a new problem. The current episode started 1 to 4 weeks ago. The problem has been unchanged. There has been no fever. The pain is moderate. Associated symptoms include headaches and a sore throat. He has tried antibiotics for the symptoms. The treatment provided no relief.    Patient also relates right elbow pain discomfort was some weakness in the right arm he was lifting up a cinder block he felt severe pain in his right arm and since then is had some weakness  Review of Systems  HENT: Positive for ear pain and sore throat.   Neurological: Positive for headaches.       Objective:   Physical Exam Eardrums are normal throat is normal neck supple lungs clear heart regular Discomfort in the right biceps tendon insertion near the elbow along with some weakness in the arm       Assessment & Plan:  Possible biceps tendon tear versus rupture I believe it would be wise for patient to see orthopedics referral recommended  Eustachian tube dysfunction allergy medicines recommended if not improved over the next 2-3 weeks notify us we will set up with ENT

## 2015-10-21 ENCOUNTER — Encounter: Payer: Self-pay | Admitting: Family Medicine

## 2015-11-17 ENCOUNTER — Other Ambulatory Visit: Payer: Self-pay | Admitting: Orthopedic Surgery

## 2015-11-21 ENCOUNTER — Encounter (HOSPITAL_BASED_OUTPATIENT_CLINIC_OR_DEPARTMENT_OTHER): Payer: Self-pay | Admitting: *Deleted

## 2015-11-24 ENCOUNTER — Ambulatory Visit (HOSPITAL_BASED_OUTPATIENT_CLINIC_OR_DEPARTMENT_OTHER): Payer: Commercial Managed Care - HMO | Admitting: Anesthesiology

## 2015-11-24 ENCOUNTER — Ambulatory Visit (HOSPITAL_BASED_OUTPATIENT_CLINIC_OR_DEPARTMENT_OTHER)
Admission: RE | Admit: 2015-11-24 | Discharge: 2015-11-24 | Disposition: A | Discharge status: Home / Self Care | Payer: Commercial Managed Care - HMO | Source: Ambulatory Visit | Attending: Orthopedic Surgery | Admitting: Orthopedic Surgery

## 2015-11-24 ENCOUNTER — Encounter (HOSPITAL_BASED_OUTPATIENT_CLINIC_OR_DEPARTMENT_OTHER): Payer: Self-pay | Admitting: Anesthesiology

## 2015-11-24 ENCOUNTER — Encounter (HOSPITAL_BASED_OUTPATIENT_CLINIC_OR_DEPARTMENT_OTHER)
Admission: RE | Disposition: A | Discharge status: Home / Self Care | Payer: Self-pay | Source: Ambulatory Visit | Attending: Orthopedic Surgery

## 2015-11-24 DIAGNOSIS — Z79899 Other long term (current) drug therapy: Secondary | ICD-10-CM | POA: Diagnosis not present

## 2015-11-24 DIAGNOSIS — S46211A Strain of muscle, fascia and tendon of other parts of biceps, right arm, initial encounter: Secondary | ICD-10-CM | POA: Insufficient documentation

## 2015-11-24 DIAGNOSIS — E785 Hyperlipidemia, unspecified: Secondary | ICD-10-CM | POA: Insufficient documentation

## 2015-11-24 DIAGNOSIS — X500XXA Overexertion from strenuous movement or load, initial encounter: Secondary | ICD-10-CM | POA: Insufficient documentation

## 2015-11-24 HISTORY — DX: Adverse effect of unspecified anesthetic, initial encounter: T41.45XA

## 2015-11-24 HISTORY — PX: DISTAL BICEPS TENDON REPAIR: SHX1461

## 2015-11-24 HISTORY — DX: Strain of muscle, fascia and tendon of other parts of biceps, unspecified arm, initial encounter: S46.219A

## 2015-11-24 HISTORY — DX: Headache: R51

## 2015-11-24 HISTORY — DX: Other complications of anesthesia, initial encounter: T88.59XA

## 2015-11-24 HISTORY — DX: Headache, unspecified: R51.9

## 2015-11-24 SURGERY — REPAIR, TENDON, BICEPS, DISTAL
Anesthesia: General | Site: Elbow | Laterality: Right

## 2015-11-24 MED ORDER — PROPOFOL 10 MG/ML IV BOLUS
INTRAVENOUS | Status: DC | PRN
  Administered 2015-11-24: 200 mg via INTRAVENOUS

## 2015-11-24 MED ORDER — PROMETHAZINE HCL 25 MG/ML IJ SOLN: 6.2500 mg | INTRAMUSCULAR | Status: DC | PRN

## 2015-11-24 MED ORDER — FENTANYL CITRATE (PF) 100 MCG/2ML IJ SOLN
INTRAMUSCULAR | Status: AC
  Filled 2015-11-24: qty 2

## 2015-11-24 MED ORDER — CHLORHEXIDINE GLUCONATE 4 % EX LIQD: 60.0000 mL | Freq: Once | CUTANEOUS | Status: DC

## 2015-11-24 MED ORDER — MIDAZOLAM HCL 5 MG/5ML IJ SOLN
INTRAMUSCULAR | Status: DC | PRN
  Administered 2015-11-24: 2 mg via INTRAVENOUS

## 2015-11-24 MED ORDER — SCOPOLAMINE 1 MG/3DAYS TD PT72: 1.0000 | MEDICATED_PATCH | Freq: Once | TRANSDERMAL | Status: DC | PRN

## 2015-11-24 MED ORDER — CEFAZOLIN SODIUM-DEXTROSE 2-4 GM/100ML-% IV SOLN
INTRAVENOUS | Status: AC
  Filled 2015-11-24: qty 100

## 2015-11-24 MED ORDER — MIDAZOLAM HCL 2 MG/2ML IJ SOLN
INTRAMUSCULAR | Status: AC
  Filled 2015-11-24: qty 2

## 2015-11-24 MED ORDER — LIDOCAINE 2% (20 MG/ML) 5 ML SYRINGE
INTRAMUSCULAR | Status: AC
  Filled 2015-11-24: qty 5

## 2015-11-24 MED ORDER — LIDOCAINE HCL (CARDIAC) 20 MG/ML IV SOLN
INTRAVENOUS | Status: DC | PRN
  Administered 2015-11-24: 30 mg via INTRAVENOUS

## 2015-11-24 MED ORDER — ONDANSETRON HCL 4 MG/2ML IJ SOLN
INTRAMUSCULAR | Status: DC | PRN
  Administered 2015-11-24: 4 mg via INTRAVENOUS

## 2015-11-24 MED ORDER — FENTANYL CITRATE (PF) 100 MCG/2ML IJ SOLN
50.0000 ug | INTRAMUSCULAR | Status: DC | PRN
  Administered 2015-11-24: 100 ug via INTRAVENOUS

## 2015-11-24 MED ORDER — DEXAMETHASONE SODIUM PHOSPHATE 10 MG/ML IJ SOLN
INTRAMUSCULAR | Status: DC | PRN
  Administered 2015-11-24: 10 mg via INTRAVENOUS

## 2015-11-24 MED ORDER — HYDROMORPHONE HCL 1 MG/ML IJ SOLN: 0.2500 mg | INTRAMUSCULAR | Status: DC | PRN

## 2015-11-24 MED ORDER — LACTATED RINGERS IV SOLN
INTRAVENOUS | Status: DC
  Administered 2015-11-24: 14:00:00 via INTRAVENOUS

## 2015-11-24 MED ORDER — OXYCODONE-ACETAMINOPHEN 7.5-325 MG PO TABS: 1.0000 | ORAL_TABLET | ORAL | Status: DC | PRN

## 2015-11-24 MED ORDER — MIDAZOLAM HCL 2 MG/2ML IJ SOLN
1.0000 mg | INTRAMUSCULAR | Status: DC | PRN
  Administered 2015-11-24: 2 mg via INTRAVENOUS

## 2015-11-24 MED ORDER — ONDANSETRON HCL 4 MG/2ML IJ SOLN
INTRAMUSCULAR | Status: AC
  Filled 2015-11-24: qty 2

## 2015-11-24 MED ORDER — FENTANYL CITRATE (PF) 100 MCG/2ML IJ SOLN
INTRAMUSCULAR | Status: DC | PRN
  Administered 2015-11-24 (×2): 50 ug via INTRAVENOUS

## 2015-11-24 MED ORDER — GLYCOPYRROLATE 0.2 MG/ML IJ SOLN: 0.2000 mg | Freq: Once | INTRAMUSCULAR | Status: DC | PRN

## 2015-11-24 MED ORDER — CEFAZOLIN SODIUM-DEXTROSE 2-4 GM/100ML-% IV SOLN
2.0000 g | INTRAVENOUS | Status: AC
  Administered 2015-11-24: 2 g via INTRAVENOUS

## 2015-11-24 MED ORDER — DEXAMETHASONE SODIUM PHOSPHATE 10 MG/ML IJ SOLN
INTRAMUSCULAR | Status: AC
  Filled 2015-11-24: qty 1

## 2015-11-24 MED ORDER — BUPIVACAINE-EPINEPHRINE (PF) 0.5% -1:200000 IJ SOLN
INTRAMUSCULAR | Status: DC | PRN
  Administered 2015-11-24: 30 mL via PERINEURAL

## 2015-11-24 SURGICAL SUPPLY — 69 items
BLADE MINI RND TIP GREEN BEAV (BLADE) IMPLANT
BLADE SURG 15 STRL LF DISP TIS (BLADE) ×1 IMPLANT
BLADE SURG 15 STRL SS (BLADE) ×2
BNDG COHESIVE 3X5 TAN STRL LF (GAUZE/BANDAGES/DRESSINGS) ×6 IMPLANT
BNDG ESMARK 4X9 LF (GAUZE/BANDAGES/DRESSINGS) ×3 IMPLANT
BNDG GAUZE ELAST 4 BULKY (GAUZE/BANDAGES/DRESSINGS) ×3 IMPLANT
BUTTON DISTAL BICEPS (Orthopedic Implant) ×3 IMPLANT
CHLORAPREP W/TINT 26ML (MISCELLANEOUS) ×3 IMPLANT
CLIP TI MEDIUM 6 (CLIP) ×3 IMPLANT
CORDS BIPOLAR (ELECTRODE) ×3 IMPLANT
COVER BACK TABLE 60X90IN (DRAPES) ×3 IMPLANT
CUFF TOURNIQUET SINGLE 18IN (TOURNIQUET CUFF) IMPLANT
DECANTER SPIKE VIAL GLASS SM (MISCELLANEOUS) IMPLANT
DRAPE EXTREMITY T 121X128X90 (DRAPE) ×3 IMPLANT
DRAPE IMP U-DRAPE 54X76 (DRAPES) ×3 IMPLANT
DRAPE OEC MINIVIEW 54X84 (DRAPES) ×3 IMPLANT
DRAPE SURG 17X23 STRL (DRAPES) IMPLANT
DRSG PAD ABDOMINAL 8X10 ST (GAUZE/BANDAGES/DRESSINGS) ×3 IMPLANT
GAUZE SPONGE 4X4 12PLY STRL (GAUZE/BANDAGES/DRESSINGS) ×3 IMPLANT
GAUZE SPONGE 4X4 16PLY XRAY LF (GAUZE/BANDAGES/DRESSINGS) IMPLANT
GAUZE XEROFORM 1X8 LF (GAUZE/BANDAGES/DRESSINGS) ×3 IMPLANT
GLOVE BIO SURGEON STRL SZ7.5 (GLOVE) ×3 IMPLANT
GLOVE BIO SURGEON STRL SZ8.5 (GLOVE) IMPLANT
GLOVE BIOGEL PI IND STRL 7.0 (GLOVE) ×1 IMPLANT
GLOVE BIOGEL PI IND STRL 8 (GLOVE) ×1 IMPLANT
GLOVE BIOGEL PI IND STRL 8.5 (GLOVE) ×1 IMPLANT
GLOVE BIOGEL PI INDICATOR 7.0 (GLOVE) ×2
GLOVE BIOGEL PI INDICATOR 8 (GLOVE) ×2
GLOVE BIOGEL PI INDICATOR 8.5 (GLOVE) ×2
GLOVE ECLIPSE 6.5 STRL STRAW (GLOVE) ×6 IMPLANT
GLOVE SURG ORTHO 8.0 STRL STRW (GLOVE) ×3 IMPLANT
GOWN STRL REUS W/ TWL LRG LVL3 (GOWN DISPOSABLE) ×1 IMPLANT
GOWN STRL REUS W/TWL LRG LVL3 (GOWN DISPOSABLE) ×2
GOWN STRL REUS W/TWL XL LVL3 (GOWN DISPOSABLE) ×6 IMPLANT
INSERTER BUTTON (SYSTAGENIX WOUND MANAGEMENT) ×3 IMPLANT
LOOP VESSEL MAXI BLUE (MISCELLANEOUS) IMPLANT
NDL SUT 6 .5 CRC .975X.05 MAYO (NEEDLE) ×1 IMPLANT
NEEDLE 1/2 CIR CATGUT .05X1.09 (NEEDLE) IMPLANT
NEEDLE MAYO TAPER (NEEDLE) ×2
NEEDLE PRECISIONGLIDE 27X1.5 (NEEDLE) IMPLANT
NS IRRIG 1000ML POUR BTL (IV SOLUTION) ×3 IMPLANT
PACK BASIN DAY SURGERY FS (CUSTOM PROCEDURE TRAY) ×3 IMPLANT
PAD CAST 4YDX4 CTTN HI CHSV (CAST SUPPLIES) ×2 IMPLANT
PADDING CAST ABS 3INX4YD NS (CAST SUPPLIES) ×2
PADDING CAST ABS 4INX4YD NS (CAST SUPPLIES) ×2
PADDING CAST ABS COTTON 3X4 (CAST SUPPLIES) ×1 IMPLANT
PADDING CAST ABS COTTON 4X4 ST (CAST SUPPLIES) ×1 IMPLANT
PADDING CAST COTTON 4X4 STRL (CAST SUPPLIES) ×4
SLEEVE SCD COMPRESS KNEE MED (MISCELLANEOUS) ×3 IMPLANT
SPLINT PLASTER CAST XFAST 3X15 (CAST SUPPLIES) ×30 IMPLANT
SPLINT PLASTER XTRA FASTSET 3X (CAST SUPPLIES) ×60
SPONGE LAP 4X18 X RAY DECT (DISPOSABLE) ×3 IMPLANT
STOCKINETTE 4X48 STRL (DRAPES) ×3 IMPLANT
SUT 2 FIBERLOOP 20 STRT BLUE (SUTURE) ×3
SUT ETHIBOND 0 MO6 C/R (SUTURE) IMPLANT
SUT ETHILON 4 0 PS 2 18 (SUTURE) ×3 IMPLANT
SUT FIBERWIRE #2 38 T-5 BLUE (SUTURE)
SUT MERSILENE 3 0 FS 1 (SUTURE) IMPLANT
SUT SILK 2 0 SH (SUTURE) IMPLANT
SUT VIC AB 4-0 BRD 54 (SUTURE) ×3 IMPLANT
SUT VIC AB 4-0 P-3 18XBRD (SUTURE) IMPLANT
SUT VIC AB 4-0 P3 18 (SUTURE)
SUT VICRYL 4-0 PS2 18IN ABS (SUTURE) ×3 IMPLANT
SUTURE 2 FIBERLOOP 20 STRT BLU (SUTURE) ×1 IMPLANT
SUTURE FIBERWR #2 38 T-5 BLUE (SUTURE) IMPLANT
SYR BULB 3OZ (MISCELLANEOUS) ×3 IMPLANT
SYR CONTROL 10ML LL (SYRINGE) IMPLANT
TOWEL OR 17X24 6PK STRL BLUE (TOWEL DISPOSABLE) ×3 IMPLANT
UNDERPAD 30X30 (UNDERPADS AND DIAPERS) ×3 IMPLANT

## 2015-11-24 NOTE — Anesthesia Postprocedure Evaluation (Addendum)
Anesthesia Post Note  Patient: Kenneth Pontolmo E Sick Jr.  Procedure(s) Performed: Procedure(s) (LRB): REPAIR BICEPS RIGHT ELBOW  (Right)  Patient location during evaluation: PACU Anesthesia Type: General and Regional Level of consciousness: awake and alert and oriented Pain management: pain level controlled Vital Signs Assessment: post-procedure vital signs reviewed and stable Respiratory status: spontaneous breathing, nonlabored ventilation and respiratory function stable Cardiovascular status: blood pressure returned to baseline and stable Postop Assessment: no signs of nausea or vomiting Anesthetic complications: no    Last Vitals:  Filed Vitals:   11/24/15 1700 11/24/15 1715  BP: 129/79 108/65  Pulse: 83 69  Temp:    Resp: 14 10    Last Pain:  Filed Vitals:   11/24/15 1723  PainSc: 0-No pain                 Amaria Mundorf A.

## 2015-11-24 NOTE — Progress Notes (Signed)
Assisted Dr. Singer with right, ultrasound guided, supraclavicular block. Side rails up, monitors on throughout procedure. See vital signs in flow sheet. Tolerated Procedure well. 

## 2015-11-24 NOTE — H&P (Signed)
Kenneth Moon. is an 60 y.o. male.   Chief Complaint: right elbow pain HPI: [name] is a 60 year old left hand dominant male who sustained an injury to his right elbow lifting cinder blocks approximately six weeks ago. He complains that he pain in the anterior aspect, medial side of his right elbow. This initially was very painful, sharp in nature with a VAS score is 7/10. He states that the pain has diminished after being placed on a Medrol Dosepak. He is referred by Kenneth Moon, M.D. He has no prior history of injuries. No history of diabetes, thyroid problems, arthritis or gout. He states he was originally seen at Urgent Care. They referred him to Dr. Gerda Moon. He states decreased use relieves his pain, any lifting produces discomfort for him in the anterior and medial aspect of his right elbow. He is not complaining of any numbness or tingling.His MRI is reviewed. This was done by [facility], read out by Dr. Corwin Moon revealing a high-grade partial tear of the distal biceps with a surrounding radiobicipital bursitis  FAMILY HISTORY: Negative for diabetes, thyroid problems, arthritis, but positive for gout.  Dr. Fletcher Moon notes are reviewed. He has a history of a low back fusion.  History reviewed. No pertinent past medical history.  History reviewed. No pertinent surgical history.   Past Medical History  Diagnosis Date  . Hyperlipidemia   . History of migraine headaches   . Headache   . Rupture of biceps tendon     right  . Complication of anesthesia     PONV    Past Surgical History  Procedure Laterality Date  . Skull fracture elevation      Age 51  . Back surgery  2016    lumbar    Family History  Problem Relation Age of Onset  . Cancer Mother   . Cancer Father   . CAD Father     Not premature   Social History:  reports that he has never smoked. He does not have any smokeless tobacco history on file. He reports that he does not drink alcohol or use illicit  drugs.  Allergies:  Allergies  Allergen Reactions  . Allegra [Fexofenadine Hcl]     Felt bad  . Lipitor [Atorvastatin]     Headache    Medications Prior to Admission  Medication Sig Dispense Refill  . rosuvastatin (CRESTOR) 20 MG tablet Take 1 tablet (20 mg total) by mouth daily. 30 tablet 5    No results found for this or any previous visit (from the past 48 hour(s)).  No results found.   Pertinent items are noted in HPI.  Pulse 70, temperature 98.3 F (36.8 C), temperature source Oral, resp. rate 20, height  (1.676 m), weight 93.441 kg (206 lb), SpO2 100 %.  General appearance: alert, cooperative and appears stated age Head: Normocephalic, without obvious abnormality Neck: no JVD Resp: clear to auscultation bilaterally Cardio: regular rate and rhythm, S1, S2 normal, no murmur, click, rub or gallop GI: soft, non-tender; bowel sounds normal; no masses,  no organomegaly Extremities: right elbow pain Pulses: 2+ and symmetric Skin: Skin color, texture, turgor normal. No rashes or lesions Neurologic: Grossly normal Incision/Wound: na  Assessment/Plan Dx: rupture right distal biceps tendon PLAN:  We have discussed the possibility of repair with him. Risks and complications of surgery are discussed. He is aware that there is no guarantee with the surgery and possibility of infection; recurrence; injury to arteries, nerves, and tendons; incomplete  relief of symptoms; injuries to lateral antebrachial cutaneous nerve of the forearm. He would like to proceed to have this done. He is advised that this will not regain full strength for him in either flexion or supination, but he would like to have it repaired. This will be scheduled as an outpatient under regional general anesthesia.   Kenneth Moon R 11/24/2015, 2:05 PM

## 2015-11-24 NOTE — Anesthesia Preprocedure Evaluation (Addendum)
Anesthesia Evaluation  Patient identified by MRN, date of birth, ID band Patient awake    Reviewed: Allergy & Precautions, NPO status , Patient's Chart, lab work & pertinent test results  History of Anesthesia Complications Negative for: history of anesthetic complications  Airway Mallampati: II  TM Distance: >3 FB Neck ROM: Full    Dental  (+) Teeth Intact, Dental Advisory Given   Pulmonary neg pulmonary ROS,    Pulmonary exam normal        Cardiovascular negative cardio ROS Normal cardiovascular exam  Cardiac cath 2014 Final Conclusions:  1. Normal coronary anatomy 2. Normal LV function   Neuro/Psych negative neurological ROS  negative psych ROS   GI/Hepatic negative GI ROS, Neg liver ROS,   Endo/Other  negative endocrine ROS  Renal/GU negative Renal ROS     Musculoskeletal   Abdominal   Peds  Hematology   Anesthesia Other Findings   Reproductive/Obstetrics                          Anesthesia Physical Anesthesia Plan  ASA: II  Anesthesia Plan: General   Post-op Pain Management: GA combined w/ Regional for post-op pain   Induction: Intravenous  Airway Management Planned: LMA  Additional Equipment:   Intra-op Plan:   Post-operative Plan: Extubation in OR  Informed Consent: I have reviewed the patients History and Physical, chart, labs and discussed the procedure including the risks, benefits and alternatives for the proposed anesthesia with the patient or authorized representative who has indicated his/her understanding and acceptance.   Dental advisory given  Plan Discussed with: CRNA and Anesthesiologist  Anesthesia Plan Comments:         Anesthesia Quick Evaluation

## 2015-11-24 NOTE — Brief Op Note (Signed)
11/24/2015  4:45 PM  PATIENT:  Kenneth PontoElmo E Nathaniel Jr.  60 y.o. male  PRE-OPERATIVE DIAGNOSIS:  RUPTURE BICEPS RIGHT ELBOW   POST-OPERATIVE DIAGNOSIS:  RUPTURE BICEPS RIGHT ELBOW  PROCEDURE:  Procedure(s): REPAIR BICEPS RIGHT ELBOW  (Right)  SURGEON:  Surgeon(s) and Role:    * Cindee SaltGary Saniya Tranchina, MD - Primary    * Betha LoaKevin Julane Crock, MD - Assisting  PHYSICIAN ASSISTANT:   ASSISTANTS: K Traevon Meiring,MD   ANESTHESIA:   regional and general  EBL:  Total I/O In: -  Out: 2 [Blood:2]  BLOOD ADMINISTERED:none  DRAINS: none  LOCAL MEDICATIONS USED:  NONE  SPECIMEN:  No Specimen  DISPOSITION OF SPECIMEN:  N/A  COUNTS:  YES  TOURNIQUET:   Total Tourniquet Time Documented: Upper Arm (Right) - 44 minutes Total: Upper Arm (Right) - 44 minutes   DICTATION: .Other Dictation: Dictation Number [id number]  PLAN OF CARE: Discharge to home after PACU  PATIENT DISPOSITION:  PACU - hemodynamically stable.

## 2015-11-24 NOTE — Op Note (Signed)
Dictation Number [id number]

## 2015-11-24 NOTE — Anesthesia Procedure Notes (Addendum)
Anesthesia Regional Block:  Supraclavicular block  Pre-Anesthetic Checklist: ,, timeout performed, Correct Patient, Correct Site, Correct Laterality, Correct Procedure,, site marked, risks and benefits discussed, Surgical consent,  Pre-op evaluation,  At surgeon's request and post-op pain management  Laterality: Right  Prep: chloraprep       Needles:  Injection technique: Single-shot  Needle Type: Echogenic Stimulator Needle     Needle Length: 5cm 5 cm Needle Gauge: 22 and 22 G    Additional Needles:  Procedures: ultrasound guided (picture in chart) and nerve stimulator Supraclavicular block  Nerve Stimulator or Paresthesia:  Response: bicep contraction, 0.48 mA,   Additional Responses:   Narrative:  Start time: 11/24/2015 2:38 PM End time: 11/24/2015 2:48 PM Injection made incrementally with aspirations every 5 mL.  Performed by: Personally   Additional Notes: Functioning IV was confirmed and monitors applied.  A 50mm 22ga echogenic arrow stimulator was used. Sterile prep and drape,hand hygiene and sterile gloves were used.Ultrasound guidance: relevent anatomy identified, needle position confirmed, local anesthetic spread visualized around nerve(s)., vascular puncture avoided.  Image printed for medical record.  Negative aspiration and negative test dose prior to incremental administration of local anesthetic. The patient tolerated the procedure well.   Procedure Name: LMA Insertion Date/Time: 11/24/2015 3:36 PM Performed by: Genevieve NorlanderLINKA, Afsana Liera L Pre-anesthesia Checklist: Patient identified, Emergency Drugs available, Suction available, Patient being monitored and Timeout performed Patient Re-evaluated:Patient Re-evaluated prior to inductionOxygen Delivery Method: Circle system utilized Preoxygenation: Pre-oxygenation with 100% oxygen Intubation Type: IV induction Ventilation: Mask ventilation without difficulty LMA: LMA inserted LMA Size: 5.0 Number of attempts: 1 Airway  Equipment and Method: Bite block Placement Confirmation: positive ETCO2 Tube secured with: Tape Dental Injury: Teeth and Oropharynx as per pre-operative assessment

## 2015-11-24 NOTE — Discharge Instructions (Addendum)

## 2015-11-24 NOTE — Transfer of Care (Signed)
Immediate Anesthesia Transfer of Care Note  Patient: Kenneth Pontolmo E Amedee Jr.  Procedure(s) Performed: Procedure(s): REPAIR BICEPS RIGHT ELBOW  (Right)  Patient Location: PACU  Anesthesia Type:GA combined with regional for post-op pain  Level of Consciousness: awake and patient cooperative  Airway & Oxygen Therapy: Patient Spontanous Breathing and Patient connected to face mask oxygen  Post-op Assessment: Report given to RN and Post -op Vital signs reviewed and stable  Post vital signs: Reviewed and stable  Last Vitals:  Filed Vitals:   11/24/15 1440 11/24/15 1445  BP: 117/82 122/72  Pulse: 71 76  Temp:    Resp: 14 19    Last Pain: There were no vitals filed for this visit.       Complications: No apparent anesthesia complications

## 2015-11-25 ENCOUNTER — Encounter (HOSPITAL_BASED_OUTPATIENT_CLINIC_OR_DEPARTMENT_OTHER): Payer: Self-pay | Admitting: Orthopedic Surgery

## 2015-11-25 NOTE — Op Note (Signed)
Kenneth Moon, Kenneth Moon                  ACCOUNT NO.:  [id number]  MEDICAL RECORD NO.:  [id number]  LOCATION:                                 FACILITY:  PHYSICIAN:  Kenneth Salt, M.D.       DATE OF BIRTH:  04/19/1956  DATE OF PROCEDURE:  11/24/2015 DATE OF DISCHARGE:                              OPERATIVE REPORT   PREOPERATIVE DIAGNOSIS:  Ruptured biceps tendon, distal right forearm, elbow.  POSTOPERATIVE DIAGNOSIS:  Ruptured biceps tendon, distal right forearm, elbow.  SURGEON:  Kenneth Salt, MD.  OPERATION:  Repair biceps tendon, right elbow, using RetroButton.  ANESTHESIOLOGIST:  Dr. Chaney Malling.  Place of surgery:  [facility].  HISTORY:  The patient is a 60 year old male with a history of rupture of the biceps tendon.  The MRI is positive.  He has elected to undergo surgical repair.  Pre, peri, and postoperative course have been discussed along with risks and complications.  He is aware that there is no guarantee with the surgery; the possibility of infection; recurrence of injury to arteries, nerves, tendons; incomplete relief of symptoms; dystrophy; numbness and tingling in the lateral forearm; possibility of re-rupture; loss of motion.  In the preoperative area, the patient is seen, the extremity marked by both patient and surgeon.  Antibiotic given.  PROCEDURE:  A supraclavicular block was carried out in the preoperative area.  General anesthetic in the operating room.  He was prepped and draped using ChloraPrep, supine position, with the right arm free.  A 3- minute dry time was allowed.  Time-out taken, confirming the patient and procedure.  The limb was exsanguinated with an Esmarch bandage.  Sterile tourniquet placed on the upper arm was inflated to 250 mmHg.  A transverse incision was made approximately 2 fingerbreadths beneath the anterior cubital fossa flexion fold, carried down through subcutaneous tissue, bleeders were electrocauterized with bipolar.  The  lateral antebrachial cutaneous nerve was identified besides the basilic vein, crossing veins were hemoclipped.  The biceps tendon was identified.  The arteries and median nerve were identified, protected with retractors. The dissection was carried distally to the area of rupture.  The tendon had not fully retracted, in that a small portion was still intact.  This was detached from the biceps tubercle, brought out proximally, a 0.045 K- wire was then placed with the forearm in full supination.  X-rays confirmed it being placed in the central aspect of the biceps tuberosity.  The whipstitch was then placed into the distal stump of the biceps tendon after removal of the necrotic portions.  This was then brought back into its normal bed leaving the neurovascular structures superficial.  This was then left so as to allow drilling of the hole for the RetroButton.  This was done through a single cortex.  The RetroButton was then fixed to the whipstitch.  This was then placed into the defect in the hole and the biceps tuberosity after removal and irrigation of all bone chips and fragments from the drilling, this was engaged, x-rays confirmed that it was well seated against the cortical window margins and this was then tied and sutured firmly affixing it in  position up against the tuberosity to the biceps tendon.  This was done with the forearm in full supination and full extension.  There was no significant tension on the repair.  Wound was copiously irrigated with saline.  The subcutaneous tissue was then closed with interrupted 4-0 Vicryl sutures.  The skin was then closed with interrupted 4-0 nylon sutures.  A sterile compressive dressing, long- arm splint was applied.  On deflation of the tourniquet, all fingers immediately pinked.  He was taken to the recovery room for observation in satisfactory condition.  He will be [facility] home [facility] [place] [facility], on  Percocet.    ______________________________ Kenneth SaltGary Yamaira Moon, M.D.   ______________________________ Kenneth Moon, M.D.    GK/MEDQ  D:  11/24/2015  T:  11/25/2015  Job:  [id number]

## 2015-11-25 NOTE — Op Note (Signed)
Intra-operative fluoroscopic images in the AP, lateral, and oblique views were taken and evaluated by myself.  Reduction and hardware placement were confirmed.  Buton was in good position.

## 2015-12-13 ENCOUNTER — Other Ambulatory Visit: Payer: Self-pay | Admitting: Family Medicine

## 2016-01-18 ENCOUNTER — Other Ambulatory Visit: Payer: Self-pay | Admitting: Family Medicine

## 2016-01-22 IMAGING — CR DG LUMBAR SPINE COMPLETE 4+V
5 series · 5 of 5 positions shown · non-contrast
Comparison: None.

CLINICAL DATA: Left-sided lower back pain with sciatica

EXAM:
LUMBAR SPINE - COMPLETE 4+ VIEW

[view not recorded (1 of 5)]
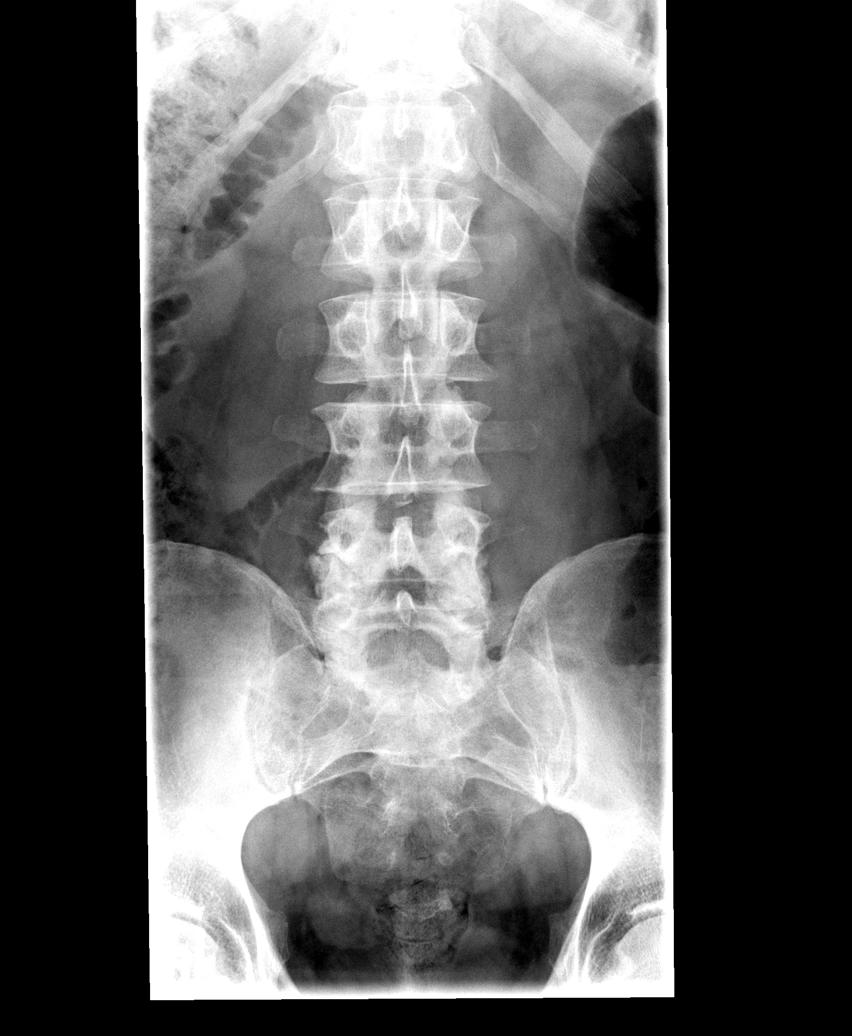

[view not recorded (2 of 5)]
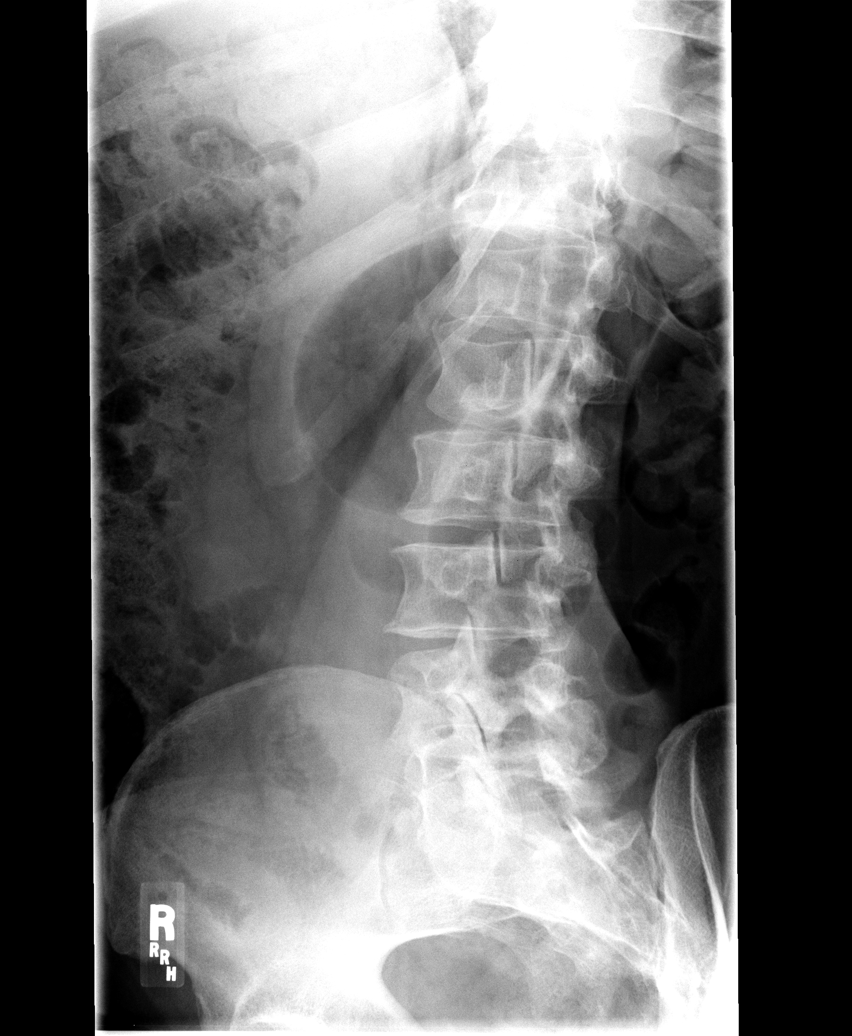

[view not recorded (3 of 5)]
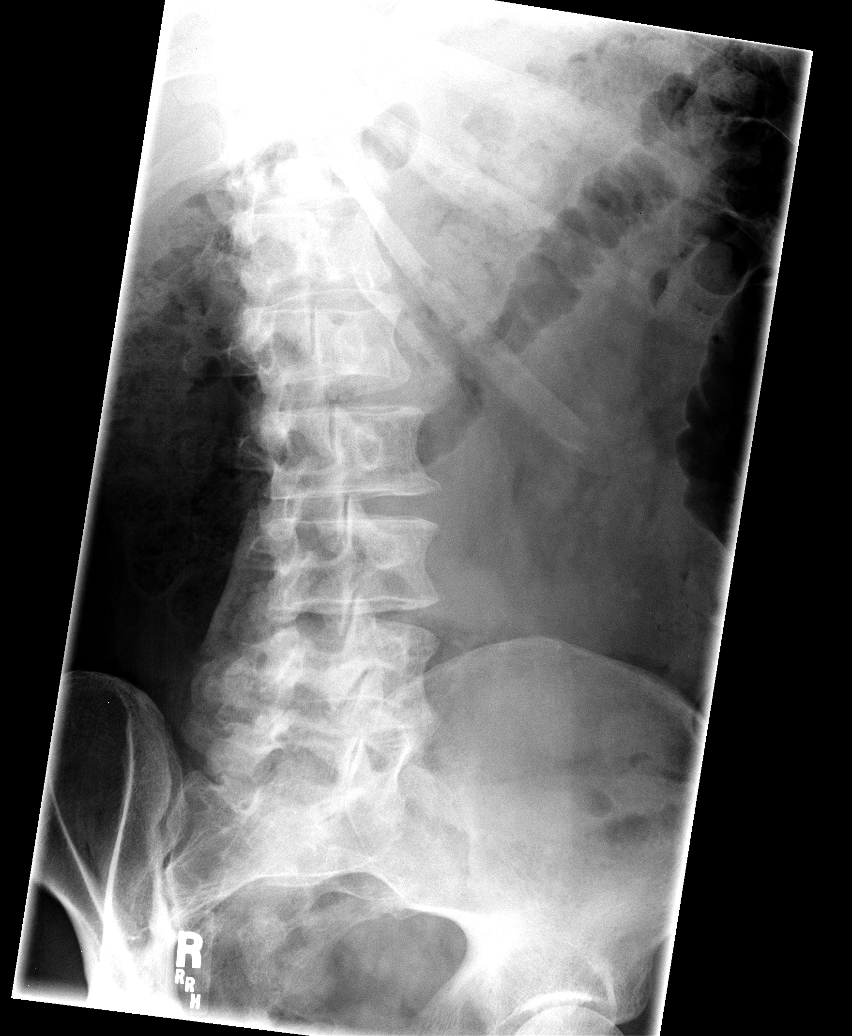

[view not recorded (4 of 5)]
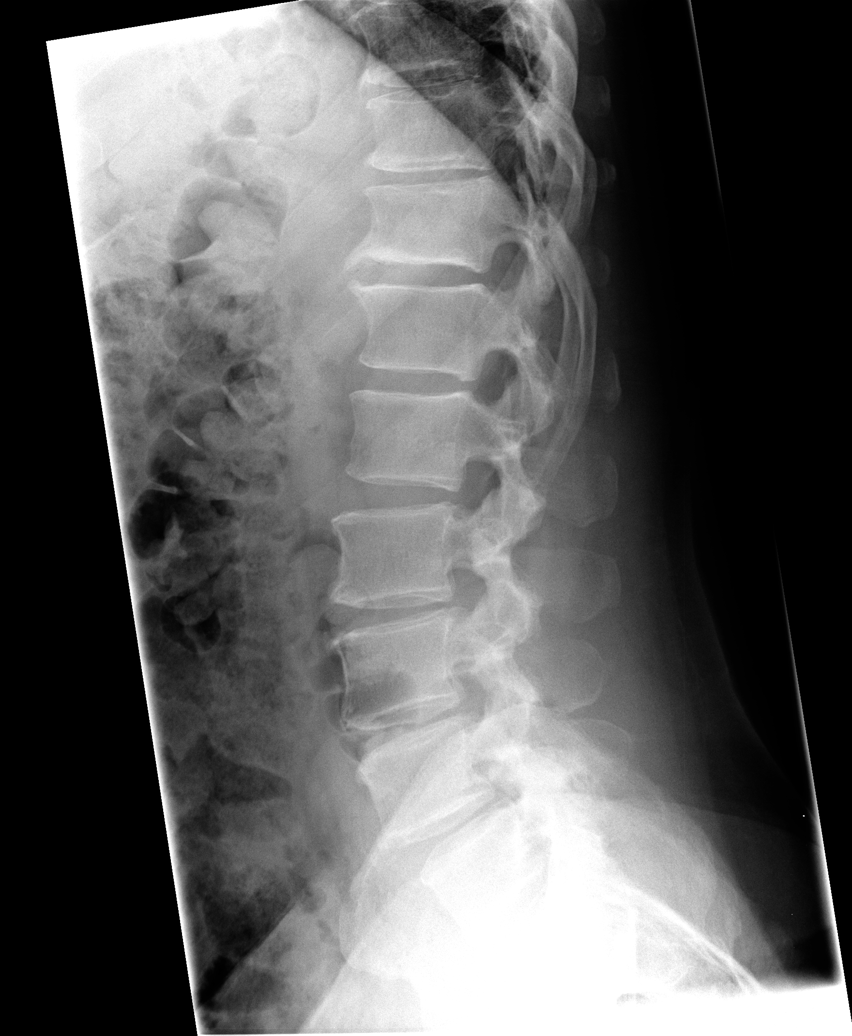

[view not recorded (5 of 5)]
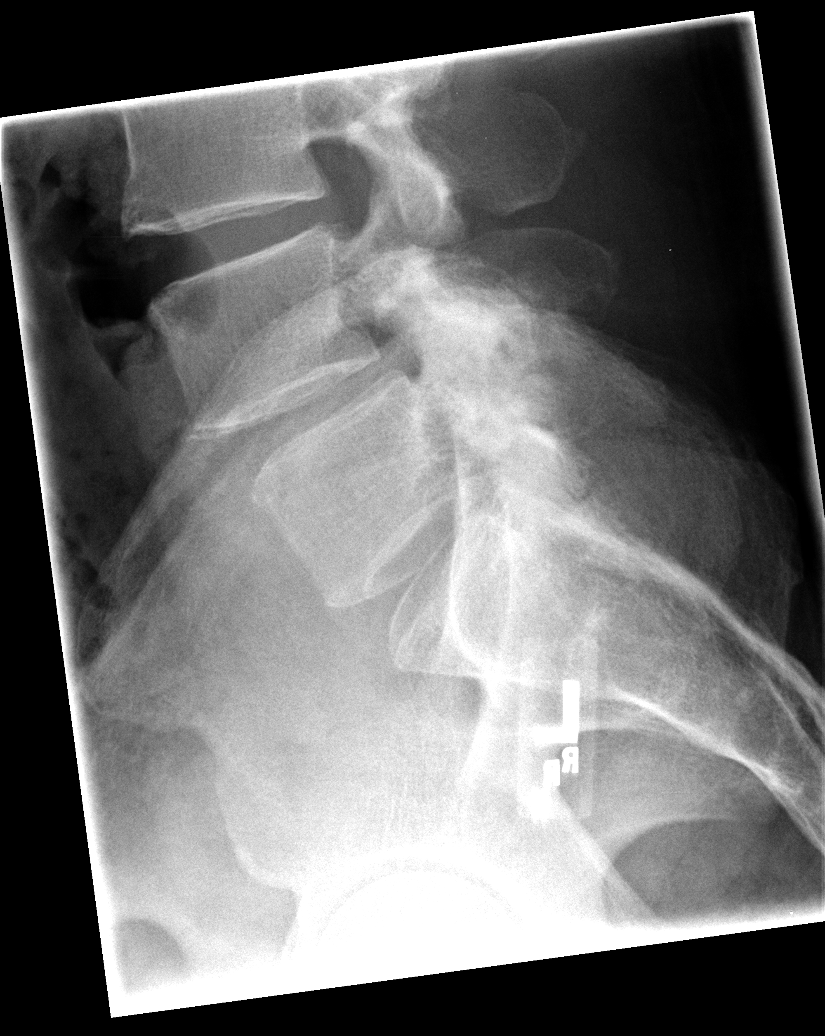

[5 of 5 positions shown; findings below may reference images not displayed]

FINDINGS: There are 5 nonrib bearing lumbar-type vertebral bodies. The
vertebral body heights are maintained. There is no spondylolysis.
There is no acute fracture. There is minimal grade 1 anterolisthesis
of L4 on L5 secondary to severe bilateral facet arthropathy. There
is mild degenerative disc disease at L4-5 and L5-S1. The disc spaces
are maintained.

The SI joints are unremarkable.
IMPRESSION: 1. Minimal grade 1 anterolisthesis of L4 on L5 secondary to severe
bilateral facet arthropathy.

## 2016-02-18 ENCOUNTER — Other Ambulatory Visit: Payer: Self-pay | Admitting: Family Medicine

## 2016-03-14 ENCOUNTER — Other Ambulatory Visit: Payer: Self-pay | Admitting: Family Medicine

## 2018-03-26 ENCOUNTER — Encounter (HOSPITAL_COMMUNITY): Payer: Self-pay

## 2018-03-26 ENCOUNTER — Emergency Department (HOSPITAL_COMMUNITY): Payer: 59

## 2018-03-26 ENCOUNTER — Other Ambulatory Visit: Payer: Self-pay

## 2018-03-26 ENCOUNTER — Emergency Department (HOSPITAL_COMMUNITY)
Admission: EM | Admit: 2018-03-26 | Discharge: 2018-03-26 | Disposition: A | Discharge status: Home / Self Care | Payer: 59 | Attending: Emergency Medicine | Admitting: Emergency Medicine

## 2018-03-26 DIAGNOSIS — R079 Chest pain, unspecified: Secondary | ICD-10-CM | POA: Diagnosis not present

## 2018-03-26 LAB — CBC
HEMATOCRIT: 41.3 % (ref 39.0–52.0)
Hemoglobin: 13.6 g/dL (ref 13.0–17.0)
MCH: 30.4 pg (ref 26.0–34.0)
MCHC: 32.9 g/dL (ref 30.0–36.0)
MCV: 92.4 fL (ref 80.0–100.0)
Platelets: 211 10*3/uL (ref 150–400)
RBC: 4.47 MIL/uL (ref 4.22–5.81)
RDW: 12.5 % (ref 11.5–15.5)
WBC: 6.3 10*3/uL (ref 4.0–10.5)
nRBC: 0 % (ref 0.0–0.2)

## 2018-03-26 LAB — BASIC METABOLIC PANEL
Anion gap: 9 (ref 5–15)
BUN: 16 mg/dL (ref 6–20)
CALCIUM: 8.7 mg/dL — AB (ref 8.9–10.3)
CO2: 26 mmol/L (ref 22–32)
CREATININE: 0.93 mg/dL (ref 0.61–1.24)
Chloride: 109 mmol/L (ref 98–111)
GFR calc Af Amer: >60 mL/min (ref >60)
Glucose, Bld: 96 mg/dL (ref 70–99)
Potassium: 3.7 mmol/L (ref 3.5–5.1)
SODIUM: 144 mmol/L (ref 135–145)

## 2018-03-26 LAB — I-STAT TROPONIN, ED
TROPONIN I, POC: 0 ng/mL (ref 0.00–0.08)
Troponin i, poc: 0 ng/mL (ref 0.00–0.08)

## 2018-03-26 NOTE — ED Triage Notes (Signed)
Per [facility], pt arrives from work after experiencing sudden onset sharp right sided CP 5/10 radiating to shoulder and neck. Pt was pale and diaphoretic on EMS arrival. Pt received 1 NTG and 324 ASA via EMS and is now pain free. Pt is alert and oriented. Pt denies cardiac hx.

## 2018-03-26 NOTE — ED Provider Notes (Signed)
Care assumed from [facility], PA-C at shift change with Trop pending.   In brief, this patient is a 62 y.o. M with PMH/o HLD who presented with CP that began this morning. CP is on the right side that radiated into the right shoulder. Please see note from previous provider for full history/physical.    PLAN: Patient has a HEART score of 2. He is pending a delta troponin. If negative, patient can be discharged home with follow-up.   MDM:  Delta Trop negative. Vitals stable. Discussed results with patient. He is resting comfortably. Patient had ample opportunity for questions and discussion. All patient's questions were answered with full understanding. Strict return precautions discussed. Patient expresses understanding and agreement to plan.    1. Chest pain, unspecified type         Maxwell Caul, PA-C 03/26/18 1600    Azalia Bilis, MD 03/26/18 1601

## 2018-03-26 NOTE — Discharge Instructions (Signed)
You can take Tylenol or Ibuprofen as directed for pain. You can alternate Tylenol and Ibuprofen every 4 hours. If you take Tylenol at 1pm, then you can take Ibuprofen at 5pm. Then you can take Tylenol again at 9pm.   Follow-up with your primary care doctor as directed.  I provided outpatient referral to cardiology for you to follow-up with also.  Return to the Emergency Department immediately if you experiencing worsening chest pain, difficulty breathing, nausea/vomiting, get very sweaty, headache or any other worsening or concerning symptoms.

## 2018-03-26 NOTE — ED Provider Notes (Signed)
[facility] EMERGENCY DEPARTMENT Provider Note   CSN: [id number] Arrival date & time: 03/26/18  1135     History   Chief Complaint Chief Complaint  Patient presents with  . Chest Pain    HPI Kenneth Moon. is a 62 y.o. male with a pmhx of HLD who presented to the ED today complaining of chest pain. Patient reports that this morning he felt somewhat lightheaded and dizzy on his way to work but didn't think much of it. On his lunch break he was walking to the break room and felt acute onset right-sided chest pain that radiated into his right shoulder. He felt associated shortness of breath. He states his episode lasted for approximately 10 minutes and then spontaneously resolved. He works as a Emergency planning/management officer so prior to the onset of chest pain he was feeling very sweaty but denies any acute onset diaphoresis associated with this chest pain. Denies any paresthesias. He states that earlier this week he had had similar episodes of chest pain independent of exertion, eating. However he states this was the worst one. He states he had similar symptoms about 5 years ago at which time he had a heart catheterization that was completely normal. He denies any tobacco use, history of diabetes, history of hypertension.  HPI  Past Medical History:  Diagnosis Date  . Complication of anesthesia    PONV  . Headache   . History of migraine headaches   . Hyperlipidemia   . Rupture of biceps tendon    right    Patient Active Problem List   Diagnosis Date Noted  . Accelerating angina (HCC) 01/07/2013  . Abnormal ECG 01/07/2013  . Hyperlipidemia 01/07/2013  . Migraines 11/26/2012    Past Surgical History:  Procedure Laterality Date  . BACK SURGERY  2016   lumbar  . DISTAL BICEPS TENDON REPAIR Right 11/24/2015   Procedure: REPAIR BICEPS RIGHT ELBOW ;  Surgeon: Cindee Salt, MD;  Location: [facility];  Service: Orthopedics;  Laterality: Right;  . SKULL FRACTURE  ELEVATION     Age 53        Home Medications    Prior to Admission medications   Medication Sig Start Date End Date Taking? Authorizing Provider  oxyCODONE-acetaminophen (PERCOCET) 7.5-325 MG tablet Take 1 tablet by mouth every 4 (four) hours as needed for severe pain. 11/24/15   Cindee Salt, MD  rosuvastatin (CRESTOR) 20 MG tablet take 1 tablet by mouth once daily 03/14/16   Babs Sciara, MD    Family History Family History  Problem Relation Age of Onset  . Cancer Mother   . Cancer Father   . CAD Father        Not premature    Social History Social History   Tobacco Use  . Smoking status: Never Smoker  Substance Use Topics  . Alcohol use: No  . Drug use: No     Allergies   Allegra [fexofenadine hcl] and Lipitor [atorvastatin]   Review of Systems Review of Systems  All other systems reviewed and are negative.    Physical Exam Updated Vital Signs BP 113/63 (BP Location: Left Arm)   Pulse 62   Temp 98.2 F (36.8 C) (Oral)   Resp 18   Ht 5\' 6"  (1.676 m)   Wt 77.1 kg   SpO2 97%   BMI 27.44 kg/m   Physical Exam  Constitutional: He is oriented to person, place, and time. He appears well-developed and well-nourished.  No distress.  HENT:  Head: Normocephalic and atraumatic.  Mouth/Throat: No oropharyngeal exudate.  Eyes: Pupils are equal, round, and reactive to light. Conjunctivae and EOM are normal. Right eye exhibits no discharge. Left eye exhibits no discharge. No scleral icterus.  Cardiovascular: Normal rate, regular rhythm, normal heart sounds and intact distal pulses. Exam reveals no gallop and no friction rub.  No murmur heard. Pulmonary/Chest: Effort normal and breath sounds normal. No respiratory distress. He has no wheezes. He has no rales. He exhibits no tenderness.  Abdominal: Soft. He exhibits no distension. There is no tenderness. There is no guarding.  Musculoskeletal: Normal range of motion. He exhibits no edema.  Neurological: He is alert  and oriented to person, place, and time.  Skin: Skin is warm and dry. No rash noted. He is not diaphoretic. No erythema. No pallor.  Psychiatric: He has a normal mood and affect. His behavior is normal.  Nursing note and vitals reviewed.    ED Treatments / Results  Labs (all labs ordered are listed, but only abnormal results are displayed) Labs Reviewed  BASIC METABOLIC PANEL - Abnormal; Notable for the following components:      Result Value   Calcium 8.7 (*)    All other components within normal limits  CBC  I-STAT TROPONIN, ED    EKG None  Radiology Dg Chest 2 View  Result Date: 03/26/2018 CLINICAL DATA:  Chest pain EXAM: CHEST - 2 VIEW COMPARISON:  01/05/2013 chest radiograph. FINDINGS: Stable cardiomediastinal silhouette with normal heart size. No pneumothorax. No pleural effusion. Lungs appear clear, with no acute consolidative airspace disease and no pulmonary edema. IMPRESSION: No active cardiopulmonary disease. Electronically Signed   By: Delbert Phenix M.D.   On: 03/26/2018 12:37    Procedures Procedures (including critical care time)  Medications Ordered in ED Medications - No data to display   Initial Impression / Assessment and Plan / ED Course  I have reviewed the triage vital signs and the nursing notes.  Pertinent labs & imaging results that were available during my care of the patient were reviewed by me and considered in my medical decision making (see chart for details).     Otherwise healthy 62 year old male presented to the ED today with acute onset right-sided chest pain and shortness of breath. Episode lasted approximately 10 minutes and spontaneously resolved. He is now chest pain-free. However, the pain is reproducible with palpation of his right anterior O pectoralis muscle. Initial troponin was negative. EKG unremarkable. His heart score is 2, delta troponin recommended. He is perc negative, doubt PE.  Pt signed out at shift change to Kenneth Laden  PA-C pending delta troponin.  Final Clinical Impressions(s) / ED Diagnoses   Final diagnoses:  None    ED Discharge Orders    None       Dub Mikes, PA-C 03/26/18 1529    Azalia Bilis, MD 03/26/18 1601

## 2018-03-26 NOTE — ED Notes (Signed)
Returned from xray

## 2019-04-15 ENCOUNTER — Ambulatory Visit (INDEPENDENT_AMBULATORY_CARE_PROVIDER_SITE_OTHER): Payer: 59 | Admitting: Family Medicine

## 2019-04-15 ENCOUNTER — Other Ambulatory Visit: Payer: Self-pay

## 2019-04-15 DIAGNOSIS — F322 Major depressive disorder, single episode, severe without psychotic features: Secondary | ICD-10-CM | POA: Diagnosis not present

## 2019-04-15 DIAGNOSIS — F411 Generalized anxiety disorder: Secondary | ICD-10-CM

## 2019-04-15 MED ORDER — HYDROXYZINE PAMOATE 50 MG PO CAPS: ORAL_CAPSULE | ORAL | 0 refills | Status: DC

## 2019-04-15 MED ORDER — SERTRALINE HCL 50 MG PO TABS: ORAL_TABLET | ORAL | 0 refills | Status: DC

## 2019-04-15 NOTE — Progress Notes (Signed)
Subjective:    Patient ID: Kenneth Ponto., male    DOB: [birth year], 63 y.o.   MRN: [id number]  HPIpt is going through a divorce and under a lot of stress. States he cries a lot and has been ill at work. People asking him whats wrong. Gad 7 and phq9 done.  GAD 7 : Generalized Anxiety Score 04/15/2019  Nervous, Anxious, on Edge 3  Control/stop worrying 3  Worry too much - different things 3  Trouble relaxing 3  Restless 3  Easily annoyed or irritable 3  Afraid - awful might happen 3  Total GAD 7 Score 21  Anxiety Difficulty Very difficult    Depression screen PHQ 2/9 04/15/2019  Decreased Interest 3  Down, Depressed, Hopeless 3  PHQ - 2 Score 6  Altered sleeping 3  Tired, decreased energy 3  Change in appetite 3  Feeling bad or failure about yourself  3  Trouble concentrating 0  Moving slowly or fidgety/restless 3  Suicidal thoughts 0  PHQ-9 Score 21  Difficult doing work/chores Very difficult   Unfortunately patient going through separation He has been with his wife for 34 years.  They have been having troubles over the past several months his wife decided to move out patient denies being suicidal or homicidal.  States he finds himself feeling anxious nervous chronic Finding himself feeling blue sat down.  He does try to occupy himself.  He states work does help keep his mind off of things.  He states he is willing to go ahead with doing some counseling he is also willing to go ahead with medications. Virtual Visit via Telephone Note  I connected with Kenneth Moon. on 04/15/19 at  1:10 PM EDT by telephone and verified that I am speaking with the correct person using two identifiers.  Location: Patient: home Provider: office   I discussed the limitations, risks, security and privacy concerns of performing an evaluation and management service by telephone and the availability of in person appointments. I also discussed with the patient that there may be a patient  responsible charge related to this service. The patient expressed understanding and agreed to proceed.   History of Present Illness:    Observations/Objective:   Assessment and Plan:   Follow Up Instructions:    I discussed the assessment and treatment plan with the patient. The patient was provided an opportunity to ask questions and all were answered. The patient agreed with the plan and demonstrated an understanding of the instructions.   The patient was advised to call back or seek an in-person evaluation if the symptoms worsen or if the condition fails to improve as anticipated. 20 I provided 20 minutes of non-face-to-face time during this encounter.      Review of Systems  Constitutional: Negative for activity change, fatigue and fever.  HENT: Negative for congestion and rhinorrhea.   Respiratory: Negative for cough and shortness of breath.   Cardiovascular: Negative for chest pain and leg swelling.  Gastrointestinal: Negative for abdominal pain, diarrhea and nausea.  Genitourinary: Negative for dysuria and hematuria.  Neurological: Negative for weakness and headaches.  Psychiatric/Behavioral: Positive for decreased concentration and dysphoric mood. Negative for agitation, behavioral problems and confusion.       Objective:   Physical Exam  Today's visit was via telephone Physical exam was not possible for this visit       Assessment & Plan:  Major depression along with anxiety Patient not suicidal We did talk  about that if he starts becoming suicidal or feeling like he cannot control things he needs to reach out to [place] immediately or go to the ER The patient states he will talk with his insurance company to find out who is covered regarding his referral for evaluation and counseling.  We had long discussion regarding medications.  Have agreed upon Zoloft 50 mg start off half a tablet daily for the first week then 1 tablet thereafter do a phone follow-up again  in 2 to 3 weeks  Will try Vistaril at nighttime to help with sleep caution drowsiness.

## 2019-04-16 NOTE — Addendum Note (Signed)
Addended by: Vicente Males on: 04/16/2019 08:52 AM   Modules accepted: Orders

## 2019-04-16 NOTE — Progress Notes (Signed)
Referral placed. Please advise, Brendale. Thank you

## 2019-04-27 ENCOUNTER — Encounter: Payer: Self-pay | Admitting: Family Medicine

## 2019-05-06 ENCOUNTER — Ambulatory Visit (INDEPENDENT_AMBULATORY_CARE_PROVIDER_SITE_OTHER): Payer: 59 | Admitting: Family Medicine

## 2019-05-06 ENCOUNTER — Other Ambulatory Visit: Payer: Self-pay

## 2019-05-06 DIAGNOSIS — F322 Major depressive disorder, single episode, severe without psychotic features: Secondary | ICD-10-CM | POA: Diagnosis not present

## 2019-05-06 MED ORDER — SERTRALINE HCL 50 MG PO TABS: ORAL_TABLET | ORAL | 4 refills | Status: DC

## 2019-05-06 NOTE — Progress Notes (Signed)
   Subjective:    Patient ID: Kenneth Moon., male    DOB: [birth year], 63 y.o.   MRN: [id number]  HPI3 week follow up on starting zoloft. Pt states he is doing better.  Patient states that the medication is kicking in and he started to feel little bit better finds himself less crying spells.  Not despondent.  Not suicidal.  States he will be starting some counseling coming up he will be setting this up if he needs our help he will let us know he states he is tolerating the medicine well initially cause nausea but no longer causing nausea.  No numbness tingling or weakness.  Able to function through the day. Virtual Visit via Telephone Note  I connected with [name]. on 05/06/19 at  1:10 PM EST by telephone and verified that I am speaking with the correct person using two identifiers.  Location: Patient: home Provider: office   I discussed the limitations, risks, security and privacy concerns of performing an evaluation and management service by telephone and the availability of in person appointments. I also discussed with the patient that there may be a patient responsible charge related to this service. The patient expressed understanding and agreed to proceed.   History of Present Illness:    Observations/Objective:   Assessment and Plan:   Follow Up Instructions:    I discussed the assessment and treatment plan with the patient. The patient was provided an opportunity to ask questions and all were answered. The patient agreed with the plan and demonstrated an understanding of the instructions.   The patient was advised to call back or seek an in-person evaluation if the symptoms worsen or if the condition fails to improve as anticipated.  I provided 15 minutes of non-face-to-face time during this encounter.        Review of Systems  Constitutional: Negative for activity change, fatigue and fever.  HENT: Negative for congestion and rhinorrhea.   Respiratory:  Negative for cough and shortness of breath.   Cardiovascular: Negative for chest pain and leg swelling.  Gastrointestinal: Negative for abdominal pain, diarrhea and nausea.  Genitourinary: Negative for dysuria and hematuria.  Neurological: Negative for weakness and headaches.  Psychiatric/Behavioral: Negative for agitation and behavioral problems.       Objective:   Physical Exam  Today's visit was via telephone Physical exam was not possible for this visit       Assessment & Plan:  Major depression actually starting to improve continue current measures warning signs were discussed I believe this patient would benefit from counseling if he needs our help in setting it up he will let us know in addition to this also believe the patient would benefit from doing a follow-up with [place] in approximately 3 to 4 months later in the spring if all doing well potentially be able to taper off the medication

## 2019-08-27 ENCOUNTER — Other Ambulatory Visit: Payer: Self-pay

## 2019-08-27 ENCOUNTER — Emergency Department
Admission: EM | Admit: 2019-08-27 | Discharge: 2019-08-27 | Disposition: A | Discharge status: Home / Self Care | Payer: 59 | Source: Home / Self Care

## 2019-08-27 DIAGNOSIS — R519 Headache, unspecified: Secondary | ICD-10-CM

## 2019-08-27 DIAGNOSIS — R197 Diarrhea, unspecified: Secondary | ICD-10-CM

## 2019-08-27 DIAGNOSIS — R509 Fever, unspecified: Secondary | ICD-10-CM

## 2019-08-27 NOTE — Discharge Instructions (Signed)
  Be sure to get a lot of rest and stay well hydrated with sports drinks, water, diluted juices, and clear sodas.  Avoid fried fatty food, spicy food, and milk as these foods can cause worsening stomach upset.   You may take 500mg  acetaminophen every 4-6 hours or in combination with ibuprofen 400-600mg  every 6-8 hours as needed for pain, inflammation, and fever.  Be sure to well hydrated with clear liquids and get at least 8 hours of sleep at night, preferably more while sick.   Please follow up with family medicine in 3-4 days if needed.

## 2019-08-27 NOTE — ED Triage Notes (Addendum)
Pt c/o fever of 100.3 yesterday. Woke up with headache today. Some diarrhea noted yesterday. No known covid exposure.

## 2019-08-27 NOTE — ED Provider Notes (Signed)
[facility]    CSN: [id number] Arrival date & time: 08/27/19  1155      History   Chief Complaint Chief Complaint  Patient presents with  . Fever  . Headache    HPI Kenneth Moon Minus. is a 64 y.o. male.   HPI  Kenneth Moon. is a 64 y.o. male presenting to UC with c/o fever Tmax 100.3*F yesterday with associated fatigue, generalized HA and diarrhea. Today he still has a HA and has had 2 episodes of watery diarrhea today. Work encouraged him to be evaluated for possible Covid. No known sick contacts or recent travel.  He too ibuprofen around 5:30AM this morning for his HA.  Denies cough, congestion, sore throat or ear pain. No abdominal pain, nausea or vomiting. He has been able to keep down some Gatorade this morning.  Last night he had some soup and crackers.    Past Medical History:  Diagnosis Date  . Complication of anesthesia    PONV  . Headache   . History of migraine headaches   . Hyperlipidemia   . Rupture of biceps tendon    right    Patient Active Problem List   Diagnosis Date Noted  . Accelerating angina ([place]) 01/07/2013  . Abnormal ECG 01/07/2013  . Hyperlipidemia 01/07/2013  . Migraines 11/26/2012    Past Surgical History:  Procedure Laterality Date  . BACK SURGERY  2016   lumbar  . DISTAL BICEPS TENDON REPAIR Right 11/24/2015   Procedure: REPAIR BICEPS RIGHT ELBOW ;  Surgeon: Daryll Brod, MD;  Location: [facility];  Service: Orthopedics;  Laterality: Right;  . SKULL FRACTURE ELEVATION     Age 10       Home Medications    Prior to Admission medications   Medication Sig Start Date End Date Taking? Authorizing Provider  hydrOXYzine (VISTARIL) 50 MG capsule 1 in the evening time as needed for sleep caution drowsiness 04/15/19   [name], MD  rosuvastatin (CRESTOR) 20 MG tablet take 1 tablet by mouth once daily Patient not taking: Reported on 03/26/2018 03/14/16   [name], MD  sertraline (ZOLOFT) 50 MG tablet 1  qd 05/06/19   [name], MD    Family History Family History  Problem Relation Age of Onset  . Cancer Mother   . Cancer Father   . CAD Father        Not premature    Social History Social History   Tobacco Use  . Smoking status: Never Smoker  . Smokeless tobacco: Never Used  Substance Use Topics  . Alcohol use: No  . Drug use: No     Allergies   Allegra [fexofenadine hcl] and Lipitor [atorvastatin]   Review of Systems Review of Systems  Constitutional: Positive for fatigue and fever. Negative for chills.  HENT: Negative for congestion, ear pain, sore throat, trouble swallowing and voice change.   Respiratory: Negative for cough and shortness of breath.   Cardiovascular: Negative for chest pain and palpitations.  Gastrointestinal: Positive for diarrhea. Negative for abdominal pain, nausea and vomiting.  Musculoskeletal: Negative for arthralgias, back pain and myalgias.  Skin: Negative for rash.  Neurological: Positive for headaches. Negative for dizziness and light-headedness.  All other systems reviewed and are negative.    Physical Exam Triage Vital Signs ED Triage Vitals [08/27/19 1217]  Enc Vitals Group     BP (!) 143/90     Pulse Rate 65  Resp 18     Temp 98 F (36.7 C)     Temp Source Oral     SpO2 99 %     Weight      Height      Head Circumference      Peak Flow      Pain Score 0     Pain Loc      Pain Edu?      Excl. in GC?    No data found.  Updated Vital Signs BP (!) 143/90 (BP Location: Right Arm)   Pulse 65   Temp 98 F (36.7 C) (Oral)   Resp 18   SpO2 99%   Visual Acuity Right Eye Distance:   Left Eye Distance:   Bilateral Distance:    Right Eye Near:   Left Eye Near:    Bilateral Near:     Physical Exam Vitals and nursing note reviewed.  Constitutional:      General: He is not in acute distress.    Appearance: He is well-developed. He is not ill-appearing, toxic-appearing or diaphoretic.  HENT:     Head:  Normocephalic and atraumatic.     Right Ear: Tympanic membrane and ear canal normal.     Left Ear: Tympanic membrane and ear canal normal.     Nose: Nose normal.     Right Sinus: No maxillary sinus tenderness or frontal sinus tenderness.     Left Sinus: No maxillary sinus tenderness or frontal sinus tenderness.     Mouth/Throat:     Lips: Pink.     Mouth: Mucous membranes are moist.     Pharynx: Oropharynx is clear. Uvula midline.  Cardiovascular:     Rate and Rhythm: Normal rate and regular rhythm.  Pulmonary:     Effort: Pulmonary effort is normal. No respiratory distress.     Breath sounds: Normal breath sounds. No stridor. No wheezing, rhonchi or rales.  Abdominal:     General: There is no distension.     Palpations: Abdomen is soft.     Tenderness: There is no abdominal tenderness.  Musculoskeletal:        General: Normal range of motion.     Cervical back: Normal range of motion and neck supple.  Skin:    General: Skin is warm and dry.  Neurological:     Mental Status: He is alert and oriented to person, place, and time.  Psychiatric:        Behavior: Behavior normal.      UC Treatments / Results  Labs (all labs ordered are listed, but only abnormal results are displayed) Labs Reviewed  NOVEL CORONAVIRUS, NAA    EKG   Radiology No results found.  Procedures Procedures (including critical care time)  Medications Ordered in UC Medications - No data to display  Initial Impression / Assessment and Plan / UC Course  I have reviewed the triage vital signs and the nursing notes.  Pertinent labs & imaging results that were available during my care of the patient were reviewed by me and considered in my medical decision making (see chart for details).     Pt appears well, NAD Covid test pending No evidence of underlying bacterial infection at this time Encouraged symptomatic tx AVS provided along with work note.   Final Clinical Impressions(s) / UC  Diagnoses   Final diagnoses:  Fever, unspecified  Generalized headache  Diarrhea, unspecified type     Discharge Instructions      Be  sure to get a lot of rest and stay well hydrated with sports drinks, water, diluted juices, and clear sodas.  Avoid fried fatty food, spicy food, and milk as these foods can cause worsening stomach upset.   You may take 500mg  acetaminophen every 4-6 hours or in combination with ibuprofen 400-600mg  every 6-8 hours as needed for pain, inflammation, and fever.  Be sure to well hydrated with clear liquids and get at least 8 hours of sleep at night, preferably more while sick.   Please follow up with family medicine in 3-4 days if needed.     ED Prescriptions    None     PDMP not reviewed this encounter.   , PA-C 08/27/19 1315

## 2019-08-28 LAB — NOVEL CORONAVIRUS, NAA: SARS-CoV-2, NAA: NOT DETECTED

## 2019-09-02 ENCOUNTER — Telehealth (HOSPITAL_COMMUNITY): Payer: Self-pay

## 2019-09-02 NOTE — Telephone Encounter (Signed)
Call to pt to confirm test for COVID-19 was negative.  Please continue good preventive care measures, including:  frequent hand-washing, avoid touching your face, cover coughs/sneezes, stay out of crowds and keep a 6 foot distance from others.  Pt verbalizes understanding and denies further questions at this time.

## 2020-02-01 ENCOUNTER — Other Ambulatory Visit: Payer: Self-pay

## 2020-02-01 ENCOUNTER — Ambulatory Visit
Admission: EM | Admit: 2020-02-01 | Discharge: 2020-02-01 | Disposition: A | Discharge status: Home / Self Care | Payer: 59 | Attending: Emergency Medicine | Admitting: Emergency Medicine

## 2020-02-01 DIAGNOSIS — T22012A Burn of unspecified degree of left forearm, initial encounter: Secondary | ICD-10-CM | POA: Diagnosis not present

## 2020-02-01 DIAGNOSIS — Z23 Encounter for immunization: Secondary | ICD-10-CM | POA: Diagnosis not present

## 2020-02-01 MED ORDER — TETANUS-DIPHTH-ACELL PERTUSSIS 5-2.5-18.5 LF-MCG/0.5 IM SUSP
0.5000 mL | Freq: Once | INTRAMUSCULAR | Status: AC
  Administered 2020-02-01: 0.5 mL via INTRAMUSCULAR

## 2020-02-01 MED ORDER — SILVER SULFADIAZINE 1 % EX CREA: 1.0000 "application " | TOPICAL_CREAM | Freq: Every day | CUTANEOUS | 0 refills | Status: DC

## 2020-02-01 NOTE — ED Triage Notes (Signed)
Pt presents with coffee burn on forearm that happened this morning

## 2020-02-01 NOTE — ED Provider Notes (Signed)
[facility]   [id number] 02/01/20 Arrival Time: 1355   CC: Burn  SUBJECTIVE:  Kenneth Leverett Camplin. is a 64 y.o. male who presents with a burn to LT forearm x this morning. Slipped hot coffee on forearm.  Denies alleviating factors.  Sore to the touch.  Denies fever, chills, nausea, vomiting, sensation changes, swelling.    ROS: As per HPI.  All other pertinent ROS negative.     Past Medical History:  Diagnosis Date  . Complication of anesthesia    PONV  . Headache   . History of migraine headaches   . Hyperlipidemia   . Rupture of biceps tendon    right   Past Surgical History:  Procedure Laterality Date  . BACK SURGERY  2016   lumbar  . DISTAL BICEPS TENDON REPAIR Right 11/24/2015   Procedure: REPAIR BICEPS RIGHT ELBOW ;  Surgeon: Cindee Salt, MD;  Location: [facility];  Service: Orthopedics;  Laterality: Right;  . SKULL FRACTURE ELEVATION     Age 73   Allergies  Allergen Reactions  . Allegra [Fexofenadine Hcl] Other (See Comments)    Made patient "feel badly"  . Lipitor [Atorvastatin] Other (See Comments)    Headaches   No current facility-administered medications on file prior to encounter.   Current Outpatient Medications on File Prior to Encounter  Medication Sig Dispense Refill  . hydrOXYzine (VISTARIL) 50 MG capsule 1 in the evening time as needed for sleep caution drowsiness 30 capsule 0  . rosuvastatin (CRESTOR) 20 MG tablet take 1 tablet by mouth once daily (Patient not taking: Reported on 03/26/2018) 7 tablet 0  . sertraline (ZOLOFT) 50 MG tablet 1 qd 30 tablet 4   Social History   Socioeconomic History  . Marital status: Married    Spouse name: Not on file  . Number of children: Not on file  . Years of education: Not on file  . Highest education level: Not on file  Occupational History  . Not on file  Tobacco Use  . Smoking status: Never Smoker  . Smokeless tobacco: Never Used  Vaping Use  . Vaping Use: Never used  Substance  and Sexual Activity  . Alcohol use: No  . Drug use: No  . Sexual activity: Not on file  Other Topics Concern  . Not on file  Social History Narrative  . Not on file   Social Determinants of Health   Financial Resource Strain:   . Difficulty of Paying Living Expenses:   Food Insecurity:   . Worried About Programme researcher, broadcasting/film/video in the Last Year:   . Barista in the Last Year:   Transportation Needs:   . Freight forwarder (Medical):   Marland Kitchen Lack of Transportation (Non-Medical):   Physical Activity:   . Days of Exercise per Week:   . Minutes of Exercise per Session:   Stress:   . Feeling of Stress :   Social Connections:   . Frequency of Communication with Friends and Family:   . Frequency of Social Gatherings with Friends and Family:   . Attends Religious Services:   . Active Member of Clubs or Organizations:   . Attends Banker Meetings:   Marland Kitchen Marital Status:   Intimate Partner Violence:   . Fear of Current or Ex-Partner:   . Emotionally Abused:   Marland Kitchen Physically Abused:   . Sexually Abused:    Family History  Problem Relation Age of Onset  .  Cancer Mother   . Cancer Father   . CAD Father        Not premature    OBJECTIVE:  Vitals:   02/01/20 1437  BP: 133/81  Pulse: 61  Resp: 17  Temp: 98.3 F (36.8 C)  TempSrc: Oral  SpO2: 98%     General appearance: alert; no distress CV: radial pulse 2+ Skin: 5 x 6 cm irregular 1st degree burn to LT distal forearm with surrounding erythema, TTP, no obvious drainage or bleeding, no blister formation Psychological: alert and cooperative; normal mood and affect   ASSESSMENT & PLAN:  1. Burn of left forearm, unspecified burn degree, initial encounter     Meds ordered this encounter  Medications  . silver sulfADIAZINE (SILVADENE) 1 % cream    Sig: Apply 1 application topically daily.    Dispense:  100 g    Refill:  0    Order Specific Question:   Supervising Provider    Answer:   Eustace Moore  [[id number]]  . Tdap (BOOSTRIX) injection 0.5 mL    Dressing applied Tetanus updated Clean with warm water and mild soap Change dressing daily Silvadene cream prescribed.  Use as directed Apply cool packs as needed (over dressing), alternate ibuprofen and/or tylenol as needed for pain  Follow up with PCP or burn center in the next few days for recheck and to ensure it is improving Return or go to the ER if you have any new or worsening symptoms such as fever, chills, nausea, vomiting, redness, swelling, discharge, if symptoms do not improve with medications, etc...    Reviewed expectations re: course of current medical issues. Questions answered. Outlined signs and symptoms indicating need for more acute intervention. Patient verbalized understanding. After Visit Summary given.          Rennis Harding, PA-C 02/01/20 1508

## 2020-02-01 NOTE — Discharge Instructions (Signed)
Dressing applied Tetanus updated Clean with warm water and mild soap Change dressing daily Silvadene cream prescribed.  Use as directed Apply cool packs as needed (over dressing), alternate ibuprofen and/or tylenol as needed for pain  Follow up with PCP or burn center in the next few days for recheck and to ensure it is improving Return or go to the ER if you have any new or worsening symptoms such as fever, chills, nausea, vomiting, redness, swelling, discharge, if symptoms do not improve with medications, etc..Marland Kitchen

## 2020-04-04 ENCOUNTER — Other Ambulatory Visit: Payer: Self-pay

## 2020-04-04 ENCOUNTER — Ambulatory Visit
Admission: RE | Admit: 2020-04-04 | Discharge: 2020-04-04 | Disposition: A | Discharge status: Home / Self Care | Payer: 59 | Source: Ambulatory Visit | Attending: Emergency Medicine | Admitting: Emergency Medicine

## 2020-04-04 VITALS — BP 147/80 | HR 89 | Temp 98.4°F | Resp 18 | Ht 66.0 in | Wt 205.0 lb

## 2020-04-04 DIAGNOSIS — J069 Acute upper respiratory infection, unspecified: Secondary | ICD-10-CM | POA: Diagnosis not present

## 2020-04-04 MED ORDER — BENZONATATE 100 MG PO CAPS: 100.0000 mg | ORAL_CAPSULE | Freq: Three times a day (TID) | ORAL | 0 refills | Status: DC

## 2020-04-04 MED ORDER — FLUTICASONE PROPIONATE 50 MCG/ACT NA SUSP: 1.0000 | Freq: Every day | NASAL | 0 refills | Status: DC

## 2020-04-04 MED ORDER — PREDNISONE 10 MG PO TABS: 20.0000 mg | ORAL_TABLET | Freq: Every day | ORAL | 0 refills | Status: DC

## 2020-04-04 NOTE — ED Provider Notes (Signed)
[facility]   [id number] 04/04/20 Arrival Time: 1950   Chief Complaint  Patient presents with   Cough     SUBJECTIVE: History from: patient.  Kenneth Moon. is a 64 y.o. male who presents with abrupt onset of nasal congestion, headache, cough sore throat for the past few days.  Reported clear nasal discharge.  Denies sick exposure to COVID, flu or strep.  Denies recent travel.  Has tried OTC medication without relief.  Denies aggravating factors.  Denies previous symptoms in the past.   Denies fever, chills, fatigue, sinus pain, rhinorrhea, sore throat, SOB, wheezing, chest pain, nausea, changes in bowel or bladder habits.     ROS: As per HPI.  All other pertinent ROS negative.     Past Medical History:  Diagnosis Date   Complication of anesthesia    PONV   Headache    History of migraine headaches    Hyperlipidemia    Rupture of biceps tendon    right   Past Surgical History:  Procedure Laterality Date   BACK SURGERY  2016   lumbar   DISTAL BICEPS TENDON REPAIR Right 11/24/2015   Procedure: REPAIR BICEPS RIGHT ELBOW ;  Surgeon: Cindee Salt, MD;  Location: [facility];  Service: Orthopedics;  Laterality: Right;   SKULL FRACTURE ELEVATION     Age 61   Allergies  Allergen Reactions   Allegra [Fexofenadine Hcl] Other (See Comments)    Made patient "feel badly"   Lipitor [Atorvastatin] Other (See Comments)    Headaches   No current facility-administered medications on file prior to encounter.   Current Outpatient Medications on File Prior to Encounter  Medication Sig Dispense Refill   hydrOXYzine (VISTARIL) 50 MG capsule 1 in the evening time as needed for sleep caution drowsiness 30 capsule 0   rosuvastatin (CRESTOR) 20 MG tablet take 1 tablet by mouth once daily (Patient not taking: Reported on 03/26/2018) 7 tablet 0   sertraline (ZOLOFT) 50 MG tablet 1 qd 30 tablet 4   silver sulfADIAZINE (SILVADENE) 1 % cream Apply 1  application topically daily. 100 g 0   Social History   Socioeconomic History   Marital status: Married    Spouse name: Not on file   Number of children: Not on file   Years of education: Not on file   Highest education level: Not on file  Occupational History   Not on file  Tobacco Use   Smoking status: Never Smoker   Smokeless tobacco: Never Used  Vaping Use   Vaping Use: Never used  Substance and Sexual Activity   Alcohol use: No   Drug use: No   Sexual activity: Not on file  Other Topics Concern   Not on file  Social History Narrative   Not on file   Social Determinants of Health   Financial Resource Strain:    Difficulty of Paying Living Expenses: Not on file  Food Insecurity:    Worried About Running Out of Food in the Last Year: Not on file   Ran Out of Food in the Last Year: Not on file  Transportation Needs:    Lack of Transportation (Medical): Not on file   Lack of Transportation (Non-Medical): Not on file  Physical Activity:    Days of Exercise per Week: Not on file   Minutes of Exercise per Session: Not on file  Stress:    Feeling of Stress : Not on file  Social Connections:  Frequency of Communication with Friends and Family: Not on file   Frequency of Social Gatherings with Friends and Family: Not on file   Attends Religious Services: Not on file   Active Member of Clubs or Organizations: Not on file   Attends Banker Meetings: Not on file   Marital Status: Not on file  Intimate Partner Violence:    Fear of Current or Ex-Partner: Not on file   Emotionally Abused: Not on file   Physically Abused: Not on file   Sexually Abused: Not on file   Family History  Problem Relation Age of Onset   Cancer Mother    Cancer Father    CAD Father        Not premature    OBJECTIVE:  Vitals:   04/04/20 1010 04/04/20 1011  BP:  (!) 147/80  Pulse:  89  Resp:  18  Temp:  98.4 F (36.9 C)  TempSrc:  Oral    SpO2:  96%  Weight: 205 lb (93 kg)   Height: 5\' 6"  (1.676 m)      General appearance: alert; appears fatigued, but nontoxic; speaking in full sentences and tolerating own secretions HEENT: NCAT; Ears: EACs clear, TMs pearly gray; Eyes: PERRL.  EOM grossly intact. Sinuses: nontender; Nose: nares patent without rhinorrhea, Throat: oropharynx clear, tonsils non erythematous or enlarged, uvula midline  Neck: supple without LAD Lungs: unlabored respirations, symmetrical air entry; cough: moderate; no respiratory distress; CTAB Heart: regular rate and rhythm.  Radial pulses 2+ symmetrical bilaterally Skin: warm and dry Psychological: alert and cooperative; normal mood and affect  LABS:  No results found for this or any previous visit (from the past 24 hour(s)).   ASSESSMENT & PLAN:  1. URI with cough and congestion     Meds ordered this encounter  Medications   benzonatate (TESSALON) 100 MG capsule    Sig: Take 1 capsule (100 mg total) by mouth every 8 (eight) hours.    Dispense:  30 capsule    Refill:  0   fluticasone (FLONASE) 50 MCG/ACT nasal spray    Sig: Place 1 spray into both nostrils daily for 14 days.    Dispense:  16 g    Refill:  0   predniSONE (DELTASONE) 10 MG tablet    Sig: Take 2 tablets (20 mg total) by mouth daily.    Dispense:  15 tablet    Refill:  0   Patient is stable at discharge.  He was given the option to have  Covid test completed.  He declined.  Discharge instructions  Get plenty of rest and push fluids Tessalon Perles prescribed for cough Prednisone as prescribed Flonase for nasal congestion and runny nose Use medications daily for symptom relief Use OTC medications like ibuprofen or tylenol as needed fever or pain Call or go to the ED if you have any new or worsening symptoms such as fever, worsening cough, shortness of breath, chest tightness, chest pain, turning blue, changes in mental status, etc...   Reviewed expectations re: course of  current medical issues. Questions answered. Outlined signs and symptoms indicating need for more acute intervention. Patient verbalized understanding. After Visit Summary given.         , FNP 04/04/20 1036

## 2020-04-04 NOTE — Discharge Instructions (Addendum)
Get plenty of rest and push fluids Tessalon Perles prescribed for cough Prednisone as prescribed  Flonase for nasal congestion and runny nose Use medications daily for symptom relief Use OTC medications like ibuprofen or tylenol as needed fever or pain Call or go to the ED if you have any new or worsening symptoms such as fever, worsening cough, shortness of breath, chest tightness, chest pain, turning blue, changes in mental status, etc...  

## 2020-04-04 NOTE — ED Triage Notes (Addendum)
Headache, cough and sore throat since throat. Does not want covid test.  Pt states he feels like he has a sinus infection.

## 2020-04-12 ENCOUNTER — Other Ambulatory Visit: Payer: Self-pay

## 2020-04-12 ENCOUNTER — Encounter: Payer: Self-pay | Admitting: Family Medicine

## 2020-04-12 ENCOUNTER — Ambulatory Visit: Payer: 59 | Admitting: Family Medicine

## 2020-04-12 VITALS — BP 118/84 | HR 94 | Temp 98.2°F | Wt 204.0 lb

## 2020-04-12 DIAGNOSIS — F321 Major depressive disorder, single episode, moderate: Secondary | ICD-10-CM

## 2020-04-12 MED ORDER — SERTRALINE HCL 50 MG PO TABS: ORAL_TABLET | ORAL | 4 refills | Status: DC

## 2020-04-12 NOTE — Progress Notes (Signed)
   Subjective:    Patient ID: Kenneth Moon., male    DOB: 1956-06-10, 64 y.o.   MRN: [id number]  HPI Patient comes in as requested by his therapist who believes he needs to be on a medication for his depression.  Patient going through some difficult times.  Working through separation.  Doing counseling.  In addition to this patient finds himself feeling stressed and anxious.  At times depressed.  Not suicidal.  Previously he had started on medicine but only took it for 6 to 8 weeks then stopped taking it now he realizes that he would benefit from being on the medicine long-term.  We did discuss his PHQ-9 and GAD-7 we also discussed stressors that are going on the counseling he is receiving plus also discuss upcoming retirement that he has.  Review of Systems  Constitutional: Negative for activity change, appetite change and fatigue.  HENT: Negative for congestion and rhinorrhea.   Respiratory: Negative for cough and shortness of breath.   Cardiovascular: Negative for chest pain and leg swelling.  Gastrointestinal: Negative for abdominal pain, nausea and vomiting.  Neurological: Negative for dizziness and headaches.  Psychiatric/Behavioral: Positive for dysphoric mood. Negative for agitation, behavioral problems, confusion, hallucinations, self-injury and suicidal ideas. The patient is nervous/anxious.        Objective:   Physical Exam  Lungs clear heart regular      Assessment & Plan:  1. Depression, major, single episode, moderate (HCC) Patient not suicidal Go ahead and start sertraline as directed See patient back in a virtual visit 3 to 4 weeks If patient starts become suicidal immediately seek out help here or ER or behavioral health Continue counseling Follow-up for further discussion in 3 to 4 weeks virtual More than likely bump up the dose of sertraline 100 mg at that time

## 2020-05-10 ENCOUNTER — Encounter: Payer: Self-pay | Admitting: Family Medicine

## 2020-05-10 ENCOUNTER — Ambulatory Visit: Payer: 59 | Admitting: Family Medicine

## 2020-05-10 ENCOUNTER — Other Ambulatory Visit: Payer: Self-pay

## 2020-05-10 VITALS — BP 114/72 | HR 84 | Temp 97.9°F | Wt 202.0 lb

## 2020-05-10 DIAGNOSIS — Z23 Encounter for immunization: Secondary | ICD-10-CM | POA: Diagnosis not present

## 2020-05-10 DIAGNOSIS — R5383 Other fatigue: Secondary | ICD-10-CM

## 2020-05-10 DIAGNOSIS — Z79899 Other long term (current) drug therapy: Secondary | ICD-10-CM

## 2020-05-10 DIAGNOSIS — Z125 Encounter for screening for malignant neoplasm of prostate: Secondary | ICD-10-CM

## 2020-05-10 DIAGNOSIS — Z1329 Encounter for screening for other suspected endocrine disorder: Secondary | ICD-10-CM

## 2020-05-10 DIAGNOSIS — E785 Hyperlipidemia, unspecified: Secondary | ICD-10-CM

## 2020-05-10 DIAGNOSIS — F320 Major depressive disorder, single episode, mild: Secondary | ICD-10-CM

## 2020-05-10 NOTE — Progress Notes (Signed)
   Subjective:    Patient ID: Kenneth Ponto., male    DOB: [birth year], 64 y.o.   MRN: [id number]  HPI follow up on depression. Started on sertraline at last visit.  Patient feels he is doing good with the medicine.  Is focus and how he is feeling is doing better Denies feeling depressed or anxious currently Adjusting well to what is going on Will be doing some ongoing counseling Denies any setbacks recently  Would like a flu vaccine today.   Review of Systems     Objective:   Physical Exam  Lungs clear heart regular HEENT benign      Assessment & Plan:  1. Depression, major, single episode, mild (HCC) Continue medication continue counseling let us know if things are not improving over the course of the next several weeks recheck again in 3 months time Patient not suicidal feeling improved wants to stay on current dose 2. Other fatigue Check lab work because of underlying fatigue and tiredness more than likely this is related to deconditioning as well as depression - Basic metabolic panel - TSH - CBC with Differential/Platelet  3. Hyperlipidemia, unspecified hyperlipidemia type Lipid screening recommended as history of hyperlipidemia may need to be back on medication - Lipid panel  4. Screening for prostate cancer PSA ordered recommend wellness exam within the next few months - PSA  5. High risk medication use Liver function order - Hepatic function panel  6. Screening for thyroid disorder Thyroid order - TSH  7. Need for vaccination Flu shot today - Flu Vaccine QUAD 6+ mos PF IM (Fluarix Quad PF)

## 2020-06-23 ENCOUNTER — Telehealth: Payer: Self-pay | Admitting: Family Medicine

## 2020-06-23 NOTE — Telephone Encounter (Signed)
Patient has physical 1/11 and needing labs done

## 2020-06-23 NOTE — Telephone Encounter (Signed)
Labs have been ordered and pt notified

## 2020-06-24 LAB — CBC WITH DIFFERENTIAL/PLATELET
Basophils Absolute: 0 10*3/uL (ref 0.0–0.2)
Basos: 0 %
EOS (ABSOLUTE): 0.1 10*3/uL (ref 0.0–0.4)
Eos: 1 %
Hematocrit: 41.8 % (ref 37.5–51.0)
Hemoglobin: 14.2 g/dL (ref 13.0–17.7)
Immature Grans (Abs): 0 10*3/uL (ref 0.0–0.1)
Immature Granulocytes: 0 %
Lymphocytes Absolute: 1.6 10*3/uL (ref 0.7–3.1)
Lymphs: 21 %
MCH: 30.5 pg (ref 26.6–33.0)
MCHC: 34 g/dL (ref 31.5–35.7)
MCV: 90 fL (ref 79–97)
Monocytes Absolute: 0.6 10*3/uL (ref 0.1–0.9)
Monocytes: 8 %
Neutrophils Absolute: 5.2 10*3/uL (ref 1.4–7.0)
Neutrophils: 70 %
Platelets: 255 10*3/uL (ref 150–450)
RBC: 4.66 x10E6/uL (ref 4.14–5.80)
RDW: 12.7 % (ref 11.6–15.4)
WBC: 7.5 10*3/uL (ref 3.4–10.8)

## 2020-06-24 LAB — BASIC METABOLIC PANEL
BUN/Creatinine Ratio: 12 (ref 10–24)
BUN: 13 mg/dL (ref 8–27)
CO2: 23 mmol/L (ref 20–29)
Calcium: 9 mg/dL (ref 8.6–10.2)
Chloride: 104 mmol/L (ref 96–106)
Creatinine, Ser: 1.1 mg/dL (ref 0.76–1.27)
GFR calc Af Amer: 83 mL/min/{1.73_m2} (ref >59)
GFR calc non Af Amer: 72 mL/min/{1.73_m2} (ref >59)
Glucose: 85 mg/dL (ref 65–99)
Potassium: 4.1 mmol/L (ref 3.5–5.2)
Sodium: 142 mmol/L (ref 134–144)

## 2020-06-24 LAB — PSA: Prostate Specific Ag, Serum: 0.9 ng/mL (ref 0.0–4.0)

## 2020-06-24 LAB — HEPATIC FUNCTION PANEL
ALT: 18 IU/L (ref 0–44)
AST: 16 IU/L (ref 0–40)
Albumin: 4.2 g/dL (ref 3.8–4.8)
Alkaline Phosphatase: 82 IU/L (ref 44–121)
Bilirubin Total: 0.3 mg/dL (ref 0.0–1.2)
Bilirubin, Direct: 0.11 mg/dL (ref 0.00–0.40)
Total Protein: 7 g/dL (ref 6.0–8.5)

## 2020-06-24 LAB — LIPID PANEL
Chol/HDL Ratio: 8.6 ratio — ABNORMAL HIGH (ref 0.0–5.0)
Cholesterol, Total: 258 mg/dL — ABNORMAL HIGH (ref 100–199)
HDL: 30 mg/dL — ABNORMAL LOW (ref >39)
LDL Chol Calc (NIH): 168 mg/dL — ABNORMAL HIGH (ref 0–99)
Triglycerides: 313 mg/dL — ABNORMAL HIGH (ref 0–149)
VLDL Cholesterol Cal: 60 mg/dL — ABNORMAL HIGH (ref 5–40)

## 2020-06-24 LAB — TSH: TSH: 1.64 u[IU]/mL (ref 0.450–4.500)

## 2020-06-28 ENCOUNTER — Other Ambulatory Visit: Payer: Self-pay

## 2020-06-28 ENCOUNTER — Encounter: Payer: Self-pay | Admitting: Family Medicine

## 2020-06-28 ENCOUNTER — Ambulatory Visit (INDEPENDENT_AMBULATORY_CARE_PROVIDER_SITE_OTHER): Payer: 59 | Admitting: Family Medicine

## 2020-06-28 VITALS — BP 124/82 | HR 84 | Temp 97.6°F | Ht 66.0 in | Wt 205.0 lb

## 2020-06-28 DIAGNOSIS — R519 Headache, unspecified: Secondary | ICD-10-CM | POA: Diagnosis not present

## 2020-06-28 DIAGNOSIS — E785 Hyperlipidemia, unspecified: Secondary | ICD-10-CM

## 2020-06-28 DIAGNOSIS — Z79899 Other long term (current) drug therapy: Secondary | ICD-10-CM

## 2020-06-28 DIAGNOSIS — Z0000 Encounter for general adult medical examination without abnormal findings: Secondary | ICD-10-CM

## 2020-06-28 DIAGNOSIS — G4482 Headache associated with sexual activity: Secondary | ICD-10-CM | POA: Diagnosis not present

## 2020-06-28 MED ORDER — ROSUVASTATIN CALCIUM 5 MG PO TABS: ORAL_TABLET | ORAL | 4 refills | Status: DC

## 2020-06-28 MED ORDER — SERTRALINE HCL 50 MG PO TABS: ORAL_TABLET | ORAL | 6 refills | Status: DC

## 2020-06-28 NOTE — Patient Instructions (Signed)

## 2020-06-28 NOTE — Progress Notes (Addendum)
The patient comes in today for a wellness visit.    A review of their health history was completed.  A review of medications was also completed.  Any needed refills; hydroxyzine  Eating habits: not health conscious  Falls/  MVA accidents in past few months: none  Regular exercise: none  Specialist pt sees on regular basis: none  Preventative health issues were discussed.   Additional concerns: hemmoroids  Patient denies any type of chest tightness pressure pain shortness of breath denies swelling in the legs denies any rectal bleeding constipation issues hematuria  Please see vital signs Neck no masses eardrums normal throat is normal lungs clear no crackles heart regular no murmurs extremities no edema abdomen soft no guarding rebound prostate exam normal  1. Routine general medical examination at a health care facility Adult wellness-complete.wellness physical was conducted today. Importance of diet and exercise were discussed in detail.  In addition to this a discussion regarding safety was also covered. We also reviewed over immunizations and gave recommendations regarding current immunization needed for age.  In addition to this additional areas were also touched on including: Preventative health exams needed:  Colonoscopy referral made for colonoscopy more than likely will not get this completed until March  Patient was advised yearly wellness exam   2. Headache associated with sexual activity Severe headache that occurred during sex, relates that this was the worst pain ever with nausea throwing up took 30 minutes for the pain to go away He has never had this problem before.  States the pain was the worst he ever had he relates the pain took at least 30 minutes to go away he denied any unilateral numbness weakness with it Denied any blurred vision had moderate nausea with vomiting  He has also had some headaches in the middle of the night with this We need to rule  out the possibility of increased pressure, tumor, aneurysm. - MR Brain W Wo Contrast - MR ANGIO HEAD WO CONTRAST  3. Nocturnal headaches Please see above - MR Brain W Wo Contrast - MR ANGIO HEAD WO CONTRAST  4. Hyperlipidemia, unspecified hyperlipidemia type Lipid profile recommended patient did not tolerate Lipitor we will try low-dose Crestor he will start off just 2 days a week and he will give [place] feedback within 3 to 4 weeks how that is doing  Cholesterol profile in approximately 3 to 4 months with follow-up office visit within 6 months - Lipid panel  5. High risk medication use Please see above - Hepatic function panel

## 2020-06-30 ENCOUNTER — Telehealth: Payer: Self-pay | Admitting: Family Medicine

## 2020-07-12 ENCOUNTER — Other Ambulatory Visit: Payer: Self-pay

## 2020-07-12 ENCOUNTER — Ambulatory Visit (HOSPITAL_COMMUNITY)
Admission: RE | Admit: 2020-07-12 | Discharge: 2020-07-12 | Disposition: A | Discharge status: Home / Self Care | Payer: 59 | Source: Ambulatory Visit | Attending: Family Medicine | Admitting: Family Medicine

## 2020-07-12 DIAGNOSIS — G4482 Headache associated with sexual activity: Secondary | ICD-10-CM | POA: Insufficient documentation

## 2020-07-12 DIAGNOSIS — R519 Headache, unspecified: Secondary | ICD-10-CM | POA: Insufficient documentation

## 2020-07-12 MED ORDER — GADOBUTROL 1 MMOL/ML IV SOLN
10.0000 mL | Freq: Once | INTRAVENOUS | Status: AC | PRN
  Administered 2020-07-12: 10 mL via INTRAVENOUS

## 2020-07-31 ENCOUNTER — Encounter: Payer: Self-pay | Admitting: Family Medicine

## 2020-10-05 ENCOUNTER — Ambulatory Visit
Admission: RE | Admit: 2020-10-05 | Discharge: 2020-10-05 | Disposition: A | Discharge status: Home / Self Care | Payer: 59 | Source: Ambulatory Visit | Attending: Emergency Medicine | Admitting: Emergency Medicine

## 2020-10-05 ENCOUNTER — Other Ambulatory Visit: Payer: Self-pay

## 2020-10-05 VITALS — BP 150/91 | HR 82 | Temp 98.0°F | Resp 16

## 2020-10-05 DIAGNOSIS — S76301A Unspecified injury of muscle, fascia and tendon of the posterior muscle group at thigh level, right thigh, initial encounter: Secondary | ICD-10-CM | POA: Diagnosis not present

## 2020-10-05 DIAGNOSIS — S76311A Strain of muscle, fascia and tendon of the posterior muscle group at thigh level, right thigh, initial encounter: Secondary | ICD-10-CM

## 2020-10-05 MED ORDER — CYCLOBENZAPRINE HCL 10 MG PO TABS: 10.0000 mg | ORAL_TABLET | Freq: Two times a day (BID) | ORAL | 0 refills | Status: DC | PRN

## 2020-10-05 MED ORDER — MELOXICAM 15 MG PO TABS: 15.0000 mg | ORAL_TABLET | Freq: Every day | ORAL | 0 refills | Status: DC

## 2020-10-05 NOTE — ED Provider Notes (Signed)
[facility]   [id number] 10/05/20 Arrival Time: 1350  CC: RT hamstring pain  SUBJECTIVE: History from: patient. Kenneth E Anzel Kearse. is a 65 y.o. male complains of RT hamstring pain and injury x this morning.  States he stepped down while walking into basement and felt pain to RT hamstring.  Localizes the pain to the right hamstring.  Describes the pain as intermittent.  Has NOT tried OTC medications without relief.  Symptoms are made worse with walking, bearing weight.  Denies similar symptoms in the past.  Denies fever, chills, erythema, ecchymosis, effusion, weakness, numbness and tingling.    ROS: As per HPI.  All other pertinent ROS negative.     Past Medical History:  Diagnosis Date  . Complication of anesthesia    PONV  . Headache   . History of migraine headaches   . Hyperlipidemia   . Rupture of biceps tendon    right   Past Surgical History:  Procedure Laterality Date  . BACK SURGERY  2016   lumbar  . DISTAL BICEPS TENDON REPAIR Right 11/24/2015   Procedure: REPAIR BICEPS RIGHT ELBOW ;  Surgeon: Cindee Salt, MD;  Location: [facility];  Service: Orthopedics;  Laterality: Right;  . SKULL FRACTURE ELEVATION     Age 43   Allergies  Allergen Reactions  . Allegra [Fexofenadine Hcl] Other (See Comments)    Made patient "feel badly"  . Lipitor [Atorvastatin] Other (See Comments)    Headaches   No current facility-administered medications on file prior to encounter.   Current Outpatient Medications on File Prior to Encounter  Medication Sig Dispense Refill  . fluticasone (FLONASE) 50 MCG/ACT nasal spray Place 1 spray into both nostrils daily for 14 days. 16 g 0  . hydrOXYzine (VISTARIL) 50 MG capsule 1 in the evening time as needed for sleep caution drowsiness 30 capsule 0  . rosuvastatin (CRESTOR) 5 MG tablet Take one tablet every evening 30 tablet 4  . sertraline (ZOLOFT) 50 MG tablet 1 qd 30 tablet 6   Social History   Socioeconomic History  .  Marital status: Married    Spouse name: Not on file  . Number of children: Not on file  . Years of education: Not on file  . Highest education level: Not on file  Occupational History  . Not on file  Tobacco Use  . Smoking status: Never Smoker  . Smokeless tobacco: Never Used  Vaping Use  . Vaping Use: Never used  Substance and Sexual Activity  . Alcohol use: No  . Drug use: No  . Sexual activity: Not on file  Other Topics Concern  . Not on file  Social History Narrative  . Not on file   Social Determinants of Health   Financial Resource Strain: Not on file  Food Insecurity: Not on file  Transportation Needs: Not on file  Physical Activity: Not on file  Stress: Not on file  Social Connections: Not on file  Intimate Partner Violence: Not on file   Family History  Problem Relation Age of Onset  . Cancer Mother   . Cancer Father   . CAD Father        Not premature    OBJECTIVE:  Vitals:   10/05/20 1410  BP: (!) 150/91  Pulse: 82  Resp: 16  Temp: 98 F (36.7 C)  TempSrc: Tympanic  SpO2: 97%    General appearance: ALERT; in no acute distress.  Head: NCAT Lungs: Normal respiratory effort Musculoskeletal:  RT hamstring Inspection: Skin warm, dry, clear and intact without obvious erythema, effusion, or ecchymosis.  Palpation: TTP over proximal hamstring just inferior to buttock ROM: FROM active and passive Strength: deferred Skin: warm and dry Neurologic: Ambulates with minimal difficulty Psychological: alert and cooperative; normal mood and affect  ASSESSMENT & PLAN:  1. Right hamstring injury, initial encounter   2. Hamstring strain, right, initial encounter     Meds ordered this encounter  Medications  . meloxicam (MOBIC) 15 MG tablet    Sig: Take 1 tablet (15 mg total) by mouth daily.    Dispense:  20 tablet    Refill:  0    Order Specific Question:   Supervising Provider    Answer:   Eustace Moore [[id number]]  . cyclobenzaprine (FLEXERIL) 10  MG tablet    Sig: Take 1 tablet (10 mg total) by mouth 2 (two) times daily as needed for muscle spasms.    Dispense:  20 tablet    Refill:  0    Order Specific Question:   Supervising Provider    Answer:   Eustace Moore [[id number]]    Continue conservative management of rest, ice, and gentle stretches Take mobic as needed for pain relief (may cause abdominal discomfort, ulcers, and GI bleeds avoid taking with other NSAIDs) Take cyclobenzaprine at nighttime for symptomatic relief. Avoid driving or operating heavy machinery while using medication. Follow up with PCP if symptoms persist Return or go to the ER if you have any new or worsening symptoms (fever, chills, chest pain, redness, swelling, deformity, etc...)    Reviewed expectations re: course of current medical issues. Questions answered. Outlined signs and symptoms indicating need for more acute intervention. Patient verbalized understanding. After Visit Summary given.    Rennis Harding, PA-C 10/05/20 1525

## 2020-10-05 NOTE — ED Triage Notes (Signed)
Pain to RT hamstring area after stepping up this morning.

## 2020-10-05 NOTE — Discharge Instructions (Signed)
Continue conservative management of rest, ice, and gentle stretches Take mobic as needed for pain relief (may cause abdominal discomfort, ulcers, and GI bleeds avoid taking with other NSAIDs) Take cyclobenzaprine at nighttime for symptomatic relief. Avoid driving or operating heavy machinery while using medication. Follow up with PCP if symptoms persist Return or go to the ER if you have any new or worsening symptoms (fever, chills, chest pain, redness, swelling, deformity, etc...)

## 2020-12-18 ENCOUNTER — Ambulatory Visit
Admission: EM | Admit: 2020-12-18 | Discharge: 2020-12-18 | Disposition: A | Discharge status: Home / Self Care | Payer: 59 | Attending: Family Medicine | Admitting: Family Medicine

## 2020-12-18 ENCOUNTER — Other Ambulatory Visit: Payer: Self-pay

## 2020-12-18 ENCOUNTER — Encounter: Payer: Self-pay | Admitting: Emergency Medicine

## 2020-12-18 DIAGNOSIS — R509 Fever, unspecified: Secondary | ICD-10-CM | POA: Diagnosis not present

## 2020-12-18 DIAGNOSIS — J069 Acute upper respiratory infection, unspecified: Secondary | ICD-10-CM

## 2020-12-18 MED ORDER — ACETAMINOPHEN 325 MG PO TABS
650.0000 mg | ORAL_TABLET | Freq: Once | ORAL | Status: AC
  Administered 2020-12-18: 12:00:00 650 mg via ORAL

## 2020-12-18 MED ORDER — LEVOCETIRIZINE DIHYDROCHLORIDE 5 MG PO TABS: 5.0000 mg | ORAL_TABLET | Freq: Every evening | ORAL | 0 refills | Status: DC

## 2020-12-18 MED ORDER — IPRATROPIUM BROMIDE 0.03 % NA SOLN: 2.0000 | Freq: Two times a day (BID) | NASAL | 0 refills | Status: DC | PRN

## 2020-12-18 MED ORDER — PSEUDOEPH-BROMPHEN-DM 30-2-10 MG/5ML PO SYRP: 5.0000 mL | ORAL_SOLUTION | Freq: Four times a day (QID) | ORAL | 0 refills | Status: DC | PRN

## 2020-12-18 NOTE — ED Triage Notes (Addendum)
Fever, sinus pressure, fatigue and bilateral ear pain that started yesterday.

## 2020-12-18 NOTE — Discharge Instructions (Addendum)
Your COVID 19 results should result within 3-4 days. Negative results are immediately resulted to Mychart. Positive results will receive a follow-up call from our clinic. If symptoms are present, I recommend home quarantine until results are known.  Alternate Tylenol and ibuprofen as needed for body aches and fever.  Symptom management per recommendations discussed today.  If any breathing difficulty or chest pain develops go immediately to the closest emergency department for evaluation.  

## 2020-12-18 NOTE — ED Provider Notes (Signed)
[facility]    CSN: [id number] Arrival date & time: 12/18/20  1208      History   Chief Complaint No chief complaint on file.   HPI Kenneth Moon. is a 65 y.o. male.   HPI Patient presents with URI symptoms including  sinus pressure, fatigue, bilateral ear pressure, sore throat, otalgia, nasal congestion, runny nose, and sinus pressure. Unknown of COVID exposure. Denies worrisome symptoms of shortness of breath, weakness, N&V, or chest pain.  Past Medical History:  Diagnosis Date   Complication of anesthesia    PONV   Headache    History of migraine headaches    Hyperlipidemia    Rupture of biceps tendon    right    Patient Active Problem List   Diagnosis Date Noted   Accelerating angina (HCC) 01/07/2013   Abnormal ECG 01/07/2013   Hyperlipidemia 01/07/2013   Migraines 11/26/2012    Past Surgical History:  Procedure Laterality Date   BACK SURGERY  2016   lumbar   DISTAL BICEPS TENDON REPAIR Right 11/24/2015   Procedure: REPAIR BICEPS RIGHT ELBOW ;  Surgeon: Cindee Salt, MD;  Location: [facility];  Service: Orthopedics;  Laterality: Right;   SKULL FRACTURE ELEVATION     Age 54       Home Medications    Prior to Admission medications   Medication Sig Start Date End Date Taking? Authorizing Provider  cyclobenzaprine (FLEXERIL) 10 MG tablet Take 1 tablet (10 mg total) by mouth 2 (two) times daily as needed for muscle spasms. 10/05/20   Wurst, [place], PA-C  fluticasone (FLONASE) 50 MCG/ACT nasal spray Place 1 spray into both nostrils daily for 14 days. 04/04/20 05/10/20  Avegno, Zachery Dakins, FNP  hydrOXYzine (VISTARIL) 50 MG capsule 1 in the evening time as needed for sleep caution drowsiness 04/15/19   Babs Sciara, MD  meloxicam (MOBIC) 15 MG tablet Take 1 tablet (15 mg total) by mouth daily. 10/05/20   Wurst, [place], PA-C  rosuvastatin (CRESTOR) 5 MG tablet Take one tablet every evening 06/28/20   Babs Sciara, MD  sertraline  (ZOLOFT) 50 MG tablet 1 qd 06/28/20   Babs Sciara, MD    Family History Family History  Problem Relation Age of Onset   Cancer Mother    Cancer Father    CAD Father        Not premature    Social History Social History   Tobacco Use   Smoking status: Never   Smokeless tobacco: Never  Vaping Use   Vaping Use: Never used  Substance Use Topics   Alcohol use: No   Drug use: No     Allergies   Allegra [fexofenadine hcl] and Lipitor [atorvastatin]   Review of Systems Review of Systems Pertinent negatives listed in HPI   Physical Exam Triage Vital Signs ED Triage Vitals  Enc Vitals Group     BP 12/18/20 1216 130/72     Pulse Rate 12/18/20 1216 84     Resp 12/18/20 1216 18     Temp 12/18/20 1216 (!) 100.8 F (38.2 C)     Temp Source 12/18/20 1216 Oral     SpO2 12/18/20 1216 94 %     Weight --      Height --      Head Circumference --      Peak Flow --      Pain Score 12/18/20 1217 3     Pain Loc --  Pain Edu? --      Excl. in GC? --    No data found.  Updated Vital Signs BP 130/72 (BP Location: Right Arm)   Pulse 84   Temp (!) 100.8 F (38.2 C) (Oral)   Resp 18   SpO2 94%   Visual Acuity Right Eye Distance:   Left Eye Distance:   Bilateral Distance:    Right Eye Near:   Left Eye Near:    Bilateral Near:     Physical Exam Constitutional:      Appearance: Normal appearance. He is ill-appearing.  HENT:     Head: Normocephalic.     Nose: Nasal tenderness, mucosal edema, congestion and rhinorrhea present.  Cardiovascular:     Rate and Rhythm: Normal rate and regular rhythm.  Pulmonary:     Effort: Pulmonary effort is normal.     Breath sounds: Normal breath sounds. No decreased air movement or transmitted upper airway sounds.  Musculoskeletal:     Cervical back: Full passive range of motion without pain.  Neurological:     Mental Status: He is alert.  Psychiatric:        Attention and Perception: Attention normal.        Speech:  Speech normal.        Behavior: Behavior normal.        Cognition and Memory: Cognition normal.     UC Treatments / Results  Labs (all labs ordered are listed, but only abnormal results are displayed) Labs Reviewed  COVID-19, FLU A+B NAA    EKG   Radiology No results found.  Procedures Procedures (including critical care time)  Medications Ordered in UC Medications  acetaminophen (TYLENOL) tablet 650 mg (has no administration in time range)    Initial Impression / Assessment and Plan / UC Course  I have reviewed the triage vital signs and the nursing notes.  Pertinent labs & imaging results that were available during my care of the patient were reviewed by me and considered in my medical decision making (see chart for details).    COVID/Flu test pending. Symptom management warranted only.  COVID test is positive patient is eligible for antiviral therapy given his high risk factors for complications.  GFR is 83 (CMP < 6 mos) manage fever with Tylenol and ibuprofen.  Nasal symptoms with over-the-counter antihistamines recommended.  Treatment per discharge medications/discharge instructions.  Red flags/ER precautions given. The most current CDC isolation/quarantine recommendation advised.    Final Clinical Impressions(s) / UC Diagnoses   Final diagnoses:  Fever, unspecified  Viral URI     Discharge Instructions      Your COVID 19 results should result within 3-4 days. Negative results are immediately resulted to Mychart. Positive results will receive a follow-up call from our clinic. If symptoms are present, I recommend home quarantine until results are known. Alternate Tylenol and ibuprofen as needed for body aches and fever.  Symptom management per recommendations discussed today.  If any breathing difficulty or chest pain develops go immediately to the closest emergency department for evaluation.    ED Prescriptions     Medication Sig Dispense Auth. Provider    ipratropium (ATROVENT) 0.03 % nasal spray Place 2 sprays into both nostrils 2 (two) times daily as needed for rhinitis. 30 mL Bing Neighbors, FNP   levocetirizine (XYZAL) 5 MG tablet Take 1 tablet (5 mg total) by mouth every evening. 30 tablet Bing Neighbors, FNP   brompheniramine-pseudoephedrine-DM 30-2-10 MG/5ML syrup Take 5 mLs by  mouth 4 (four) times daily as needed. 120 mL Bing Neighbors, FNP      PDMP not reviewed this encounter.   Bing Neighbors, FNP 12/19/20 1824

## 2020-12-19 LAB — COVID-19, FLU A+B NAA
Influenza A, NAA: NOT DETECTED
Influenza B, NAA: NOT DETECTED
SARS-CoV-2, NAA: DETECTED — AB

## 2020-12-20 ENCOUNTER — Telehealth: Payer: Self-pay | Admitting: Family Medicine

## 2020-12-20 MED ORDER — PAXLOVID 10 X 150 MG & 10 X 100MG PO TBPK: 2.0000 | ORAL_TABLET | Freq: Two times a day (BID) | ORAL | 0 refills | Status: AC

## 2020-12-20 NOTE — Telephone Encounter (Signed)
Please notify patient as follows:   Your test for COVID-19 was positive, meaning that you were infected with the novel coronavirus and could give the germ to others.  Please continue isolation at home for at least 5 days since the start of your symptoms. If you do not have symptoms, please isolate at home for 10 days from the day you were tested. Once you complete your 10 day quarantine, you may return to normal activities as long as you've not had a fever for over 24 hours(without taking fever reducing medicine) and your symptoms are improving. Please continue good preventive care measures, including:  frequent hand-washing, avoid touching your face, cover coughs/sneezes, stay out of crowds and keep a 6 foot distance from others.  Go to the nearest hospital emergency room if fever/cough/breathlessness are severe or illness seems like a threat to life.    Based on active medical history he qualifies for antiviral therapy with Paxlovid with decreases the risk of hospitalization related to complications of COVID19 viral infection.  The medication has been sent to the pharmacy on file . Take as directed.

## 2021-04-27 ENCOUNTER — Ambulatory Visit: Payer: Self-pay

## 2021-05-16 ENCOUNTER — Other Ambulatory Visit: Payer: Self-pay

## 2021-05-16 ENCOUNTER — Ambulatory Visit (INDEPENDENT_AMBULATORY_CARE_PROVIDER_SITE_OTHER): Payer: 59

## 2021-05-16 ENCOUNTER — Ambulatory Visit: Payer: 59 | Admitting: Podiatry

## 2021-05-16 DIAGNOSIS — M722 Plantar fascial fibromatosis: Secondary | ICD-10-CM | POA: Diagnosis not present

## 2021-05-16 MED ORDER — MELOXICAM 15 MG PO TABS: 15.0000 mg | ORAL_TABLET | Freq: Every day | ORAL | 2 refills | Status: DC

## 2021-05-16 NOTE — Patient Instructions (Signed)

## 2021-05-16 NOTE — Progress Notes (Signed)
  Subjective:  Patient ID: Kenneth Mccree., male    DOB: 10-22-1955,  MRN: [id number]  Chief Complaint  Patient presents with   Foot Pain     NP - R foot pain- possible plantar fasciitis     65 y.o. male presents with the above complaint. History confirmed with patient.  Worse when he gets up in the morning sharp pain on the bottom of the heel feels ago hot poking iron stabbing into his heel.  Objective:  Physical Exam: warm, good capillary refill, no trophic changes or ulcerative lesions, normal DP and PT pulses, and normal sensory exam. Left Foot: normal exam, no swelling, tenderness, instability; ligaments intact, full range of motion of all ankle/foot joints Right Foot: point tenderness over the heel pad and point tenderness of the mid plantar fascia  No images are attached to the encounter.  Radiographs: Multiple views x-ray of the right foot: no fracture, dislocation, swelling or degenerative changes noted and plantar calcaneal spur Assessment:   1. Plantar fasciitis of right foot      Plan:  Patient was evaluated and treated and all questions answered.  Discussed the etiology and treatment options for plantar fasciitis including stretching, formal physical therapy, supportive shoegears such as a running shoe or sneaker, pre fabricated orthoses, injection therapy, and oral medications. We also discussed the role of surgical treatment of this for patients who do not improve after exhausting non-surgical treatment options.   -XR reviewed with patient -Educated patient on stretching and icing of the affected limb -Night splint dispensed -Injection delivered to the plantar fascia of the right foot. -Rx for meloxicam. Educated on use, risks and benefits of the medication  After sterile prep with povidone-iodine solution and alcohol, the right heel was injected with 0.5cc 2% xylocaine plain, 0.5cc 0.5% marcaine plain, 5mg  triamcinolone acetonide, and 2mg  dexamethasone was  injected along the medial plantar fascia at the insertion on the plantar calcaneus. The patient tolerated the procedure well without complication.  Return in about 1 month (around 06/15/2021) for recheck plantar fasciitis.

## 2021-10-17 ENCOUNTER — Ambulatory Visit: Payer: 59 | Admitting: Nurse Practitioner

## 2021-10-17 ENCOUNTER — Encounter: Payer: Self-pay | Admitting: Nurse Practitioner

## 2021-10-17 VITALS — BP 130/82 | HR 89 | Temp 98.6°F | Wt 207.6 lb

## 2021-10-17 DIAGNOSIS — R0602 Shortness of breath: Secondary | ICD-10-CM | POA: Diagnosis not present

## 2021-10-17 DIAGNOSIS — R5383 Other fatigue: Secondary | ICD-10-CM | POA: Diagnosis not present

## 2021-10-17 DIAGNOSIS — Z131 Encounter for screening for diabetes mellitus: Secondary | ICD-10-CM | POA: Diagnosis not present

## 2021-10-17 DIAGNOSIS — E785 Hyperlipidemia, unspecified: Secondary | ICD-10-CM

## 2021-10-17 NOTE — Progress Notes (Addendum)
? ?Subjective:  ? ? Patient ID: Kenneth Moon., male    DOB: [birth year], 66 y.o.   MRN: [id number] ? ?HPI ? ?66 year old male patient with history of accelerating angina, hyperlipidemia, abnormal EKG presents to the clinic with concerns about breathing heavy. Pt states wife and sister noticed that his breathing was heavy x3 months.  Patient states that he is not that bothered with breathing heavy but states that his wife and sister insist that he be evaluated.  Patient admits to being more easily fatigued than normal, having fatigue with exertion but thinks that it was due to him getting older.  Patient states that he falls asleep easily.  Patient states that his wife states that he snores.   ? ?Patient denies swelling, headaches, chest pain, palpitations, wheezing, difficulty breathing.   ? ?Patient also states that he has noticed that sometimes when he goes from sitting to standing he will get dizzy for few seconds and then will get better.  Patient denies any loss of consciousness or falling.   ? ?Patient has hyperlipidemia but has not been taking his Crestor since he ran out of it several months ago. ? ? ?Review of Systems  ?Respiratory:  Positive for shortness of breath.   ?All other systems reviewed and are negative. ? ?   ?Objective:  ? Physical Exam ?Vitals reviewed.  ?Constitutional:   ?   General: He is not in acute distress. ?   Appearance: Normal appearance. He is obese. He is not ill-appearing, toxic-appearing or diaphoretic.  ?   Comments: O2 saturation above 96% while ambulating around the clinic. No SOB breath noted during ambulation  ?Cardiovascular:  ?   Rate and Rhythm: Normal rate and regular rhythm.  ?   Pulses: Normal pulses.  ?   Heart sounds: Normal heart sounds. No murmur heard. ?Pulmonary:  ?   Effort: Pulmonary effort is normal. No respiratory distress.  ?   Breath sounds: Normal breath sounds. No stridor. No wheezing, rhonchi or rales.  ?Chest:  ?   Chest wall: No tenderness.   ?Musculoskeletal:  ?   Comments: Grossly intact  ?Skin: ?   General: Skin is warm.  ?Neurological:  ?   Mental Status: He is alert.  ?   Comments: Grossly intact  ?Psychiatric:     ?   Mood and Affect: Mood normal.     ?   Behavior: Behavior normal.  ? ? ? ? ? ?   ?Assessment & Plan:  ?1. SOB (shortness of breath) on exertion ?-Etiology of shortness of breath on exertion is unclear at this time ?-EKG was normal.  Cardiac causes less likely at this time. However, after further review of his cardiac history, Cardiac referral placed for possible stress test. ?- Will evaluated CBC for anemias.  ?- Pulmonary etiologies such as COPD considered however, lungs CTA, O2 saturations appropriate,  and risk factors for COPD low.  ?- Orthostatic BP normal ?- CMP14+EGFR ?- CBC with Differential/Platelet ?- EKG 12-Lead ?- If lab work non-conclusive and patient continues to have symptom will consider referral to pulmonary for PFT.  ?- RTC if symptoms not better or worsen. ?- Follow up in 2 months ? ? ?2. Hyperlipidemia, unspecified hyperlipidemia type ?- Patient currently not taking Crestor as prescribed.  ?- Will evaluated Lipid profile to determine if statin therapy still necessary.  ?- Lipid Profile ? ?3. Other fatigue ?- Possibly sleep apnea. ?- Will referral to sleep studies ?- Will evaluate for presence of  anemia ?- CBC with Differential/Platelet ?- Ambulatory referral to Sleep Studies ?- EKG 12-Lead ?- RTC if symptoms not better or worsen ? ?4. Diabetes mellitus screening ?- HgB A1c  ? ?  ?Note:  This document was prepared using Dragon voice recognition software and may include unintentional dictation errors. ?Note - This record has been created using [employer].  ?Chart creation errors have been sought, but may not always  ?have been located. Such creation errors do not reflect on  ?the standard of medical care. ? ?

## 2021-10-20 ENCOUNTER — Other Ambulatory Visit: Payer: Self-pay | Admitting: Nurse Practitioner

## 2021-10-20 DIAGNOSIS — E782 Mixed hyperlipidemia: Secondary | ICD-10-CM

## 2021-10-20 LAB — CBC WITH DIFFERENTIAL/PLATELET
Basophils Absolute: 0 10*3/uL (ref 0.0–0.2)
Basos: 1 %
EOS (ABSOLUTE): 0.1 10*3/uL (ref 0.0–0.4)
Eos: 2 %
Hematocrit: 42.5 % (ref 37.5–51.0)
Hemoglobin: 14.4 g/dL (ref 13.0–17.7)
Immature Grans (Abs): 0 10*3/uL (ref 0.0–0.1)
Immature Granulocytes: 0 %
Lymphocytes Absolute: 1.6 10*3/uL (ref 0.7–3.1)
Lymphs: 29 %
MCH: 30.2 pg (ref 26.6–33.0)
MCHC: 33.9 g/dL (ref 31.5–35.7)
MCV: 89 fL (ref 79–97)
Monocytes Absolute: 0.8 10*3/uL (ref 0.1–0.9)
Monocytes: 14 %
Neutrophils Absolute: 3.1 10*3/uL (ref 1.4–7.0)
Neutrophils: 54 %
Platelets: 196 10*3/uL (ref 150–450)
RBC: 4.77 x10E6/uL (ref 4.14–5.80)
RDW: 12.8 % (ref 11.6–15.4)
WBC: 5.6 10*3/uL (ref 3.4–10.8)

## 2021-10-20 LAB — CMP14+EGFR
ALT: 16 IU/L (ref 0–44)
AST: 20 IU/L (ref 0–40)
Albumin/Globulin Ratio: 1.6 (ref 1.2–2.2)
Albumin: 4.3 g/dL (ref 3.8–4.8)
Alkaline Phosphatase: 73 IU/L (ref 44–121)
BUN/Creatinine Ratio: 15 (ref 10–24)
BUN: 16 mg/dL (ref 8–27)
Bilirubin Total: 0.4 mg/dL (ref 0.0–1.2)
CO2: 21 mmol/L (ref 20–29)
Calcium: 9 mg/dL (ref 8.6–10.2)
Chloride: 107 mmol/L — ABNORMAL HIGH (ref 96–106)
Creatinine, Ser: 1.06 mg/dL (ref 0.76–1.27)
Globulin, Total: 2.7 g/dL (ref 1.5–4.5)
Glucose: 106 mg/dL — ABNORMAL HIGH (ref 70–99)
Potassium: 4 mmol/L (ref 3.5–5.2)
Sodium: 143 mmol/L (ref 134–144)
Total Protein: 7 g/dL (ref 6.0–8.5)
eGFR: 78 mL/min/{1.73_m2} (ref >59)

## 2021-10-20 LAB — LIPID PANEL
Chol/HDL Ratio: 8.8 ratio — ABNORMAL HIGH (ref 0.0–5.0)
Cholesterol, Total: 264 mg/dL — ABNORMAL HIGH (ref 100–199)
HDL: 30 mg/dL — ABNORMAL LOW (ref >39)
LDL Chol Calc (NIH): 211 mg/dL — ABNORMAL HIGH (ref 0–99)
Triglycerides: 126 mg/dL (ref 0–149)
VLDL Cholesterol Cal: 23 mg/dL (ref 5–40)

## 2021-10-20 LAB — HEMOGLOBIN A1C
Est. average glucose Bld gHb Est-mCnc: 117 mg/dL
Hgb A1c MFr Bld: 5.7 % — ABNORMAL HIGH (ref 4.8–5.6)

## 2021-10-20 MED ORDER — ROSUVASTATIN CALCIUM 20 MG PO TABS: 20.0000 mg | ORAL_TABLET | Freq: Every day | ORAL | 1 refills | Status: DC

## 2021-10-23 NOTE — Progress Notes (Signed)
Patient has been informed per provider results and recommendations. ?

## 2021-10-23 NOTE — Progress Notes (Signed)
Mychart message sent to patient.

## 2021-10-23 NOTE — Addendum Note (Signed)
Addended by: Deneise Lever on: 10/23/2021 10:36 AM ? ? Modules accepted: Orders ? ?

## 2021-10-24 ENCOUNTER — Telehealth: Payer: Self-pay | Admitting: *Deleted

## 2021-10-24 NOTE — Progress Notes (Signed)
Patient informed of md message and recommendations. Verbalized understanding. 

## 2021-10-24 NOTE — Telephone Encounter (Signed)
Patient says he went to pick up the new cholesterol medication yesterday and his co-pay in  $109.00.  Can this be changed to something cheaper? ?

## 2021-10-25 ENCOUNTER — Other Ambulatory Visit: Payer: Self-pay | Admitting: Nurse Practitioner

## 2021-10-25 DIAGNOSIS — E785 Hyperlipidemia, unspecified: Secondary | ICD-10-CM

## 2021-10-25 MED ORDER — ATORVASTATIN CALCIUM 40 MG PO TABS: 40.0000 mg | ORAL_TABLET | Freq: Every day | ORAL | 3 refills | Status: DC

## 2021-10-25 NOTE — Telephone Encounter (Signed)
LM that prescription was sent pharmacy. ?

## 2021-11-22 ENCOUNTER — Ambulatory Visit: Payer: 59 | Admitting: Family Medicine

## 2021-11-30 ENCOUNTER — Ambulatory Visit (HOSPITAL_COMMUNITY)
Admission: RE | Admit: 2021-11-30 | Discharge: 2021-11-30 | Disposition: A | Discharge status: Home / Self Care | Payer: 59 | Source: Ambulatory Visit | Attending: Pulmonary Disease | Admitting: Pulmonary Disease

## 2021-11-30 ENCOUNTER — Encounter: Payer: Self-pay | Admitting: Pulmonary Disease

## 2021-11-30 ENCOUNTER — Ambulatory Visit: Payer: 59 | Admitting: Pulmonary Disease

## 2021-11-30 VITALS — BP 130/88 | HR 64 | Temp 98.7°F | Ht 66.0 in | Wt 207.2 lb

## 2021-11-30 DIAGNOSIS — R0609 Other forms of dyspnea: Secondary | ICD-10-CM

## 2021-11-30 DIAGNOSIS — R0683 Snoring: Secondary | ICD-10-CM | POA: Diagnosis not present

## 2021-11-30 MED ORDER — ALBUTEROL SULFATE HFA 108 (90 BASE) MCG/ACT IN AERS: 2.0000 | INHALATION_SPRAY | Freq: Four times a day (QID) | RESPIRATORY_TRACT | 5 refills | Status: DC | PRN

## 2021-11-30 MED ORDER — FLUTICASONE PROPIONATE 50 MCG/ACT NA SUSP: 1.0000 | Freq: Every day | NASAL | 5 refills | Status: DC

## 2021-11-30 MED ORDER — ARNUITY ELLIPTA 100 MCG/ACT IN AEPB: 1.0000 | INHALATION_SPRAY | Freq: Every morning | RESPIRATORY_TRACT | 5 refills | Status: DC

## 2021-11-30 NOTE — Progress Notes (Signed)
[facility]  Chief Complaint  Patient presents with   Consult    Ref for sleep consult fatigue     Past Surgical History:  He  has a past surgical history that includes Skull fracture elevation; Back surgery (2016); and Distal biceps tendon repair (Right, 11/24/2015).  Past Medical History:  Migraine headache, Hyperlipidemia  Constitutional:  BP 130/88 (BP Location: Left Arm, Patient Position: Sitting)   Pulse 64   Temp 98.7 F (37.1 C) (Temporal)   Ht 5\' 6"  (1.676 m)   Wt 207 lb 3.2 oz (94 kg)   SpO2 97% Comment: ra  BMI 33.44 kg/m   Brief Summary:  Kenneth Caliendo. is a 66 y.o. male with fatigue and dyspnea.      Subjective:   His wife has been concerned about his snoring.  This has been going on for years and getting worse.  He wakes up feeling short of breath and hears himself snore.  He gets sleepy in the afternoon and has to take a nap.  Several of his sisters have sleep apnea and use CPAP.  He goes to sleep at 1030 pm.  He falls asleep in 10 minutes.  He sleeps through the night.  He gets out of bed at 630 am.  He feels okay in the morning, but gets more sleepy later in the day.  He denies morning headache.  He does not use anything to help him fall sleep or stay awake.  He denies sleep walking, sleep talking, bruxism, or nightmares.  There is no history of restless legs.  He denies sleep hallucinations, sleep paralysis, or cataplexy.  The Epworth score is 4 out of 24.  He has noticed more trouble with his breathing when he goes outside and when he exerts himself too much.  Has been getting trouble with allergies and sinus congestion.  This settles into his chest.  He then feels tight in his chest and has cough with clear sputum.  He thinks he has some wheezing also.  He was never diagnosed with asthma but several of his sisters have asthma, and they have the same kind of symptoms.  He denies skin rash or leg swelling.  He  never smoked.  Her worked in a 77, but didn't have any breathing troubles at work.  No animal/bird exposures.  No history of pneumonia or TB.  Never in the [employer].  He is from [place].  He denies food/medication allergies.   Physical Exam:   Appearance - well kempt   ENMT - no sinus tenderness, no oral exudate, no LAN, Mallampati 4 airway, no stridor, scalloped tongue  Respiratory - equal breath sounds bilaterally, no wheezing or rales  CV - s1s2 regular rate and rhythm, no murmurs  Ext - no clubbing, no edema  Skin - no rashes  Psych - normal mood and affect   Pulmonary testing:    Chest Imaging:    Sleep Tests:    Cardiac Tests:    Social History:  He  reports that he has never smoked. He has never used smokeless tobacco. He reports that he does not drink alcohol and does not use drugs.  Family History:  His family history includes CAD in his father; Cancer in his father and mother.     Labs:      Latest Ref Rng & Units 10/19/2021    9:18 AM 06/23/2020    2:44 PM 03/26/2018   12:01  PM  CMP  Glucose 70 - 99 mg/dL 782  85  96   BUN 8 - 27 mg/dL 16  13  16    Creatinine 0.76 - 1.27 mg/dL  4.23  5.36   Sodium 134 - 144 mmol/L 143  142  144   Potassium 3.5 - 5.2 mmol/L 4.0  4.1  3.7   Chloride 96 - 106 mmol/L 107  104  109   CO2 20 - 29 mmol/L 21  23  26    Calcium 8.6 - 10.2 mg/dL 9.0  9.0  8.7   Total Protein 6.0 - 8.5 g/dL 7.0  7.0    Total Bilirubin 0.0 - 1.2 mg/dL 0.4  0.3    Alkaline Phos 44 - 121 IU/L 73  82    AST 0 - 40 IU/L 20  16    ALT 0 - 44 IU/L 16  18         Latest Ref Rng & Units 10/19/2021    9:18 AM 06/23/2020    2:44 PM 03/26/2018   12:01 PM  CBC  WBC 3.4 - 10.8 x10E3/uL 5.6  7.5  6.3   Hemoglobin 13.0 - 17.7 g/dL 08/21/2020  05/26/2018  31.5   Hematocrit 37.5 - 51.0 % 42.5  41.8  41.3   Platelets 150 - 450 x10E3/uL 196  255  211     Discussion:  He has snoring, sleep disruption, apnea, and daytime sleepiness.  He has  family history of sleep apnea.  I am concerned he could also have obstructive sleep apnea.  He has dyspnea on exertion and symptoms suggestive of allergies with asthma.  Assessment/Plan:   Snoring with excessive daytime sleepiness. - will need to arrange for a home sleep study  Allergic asthma. - will have him try flonase 1 spray each nostril nightly, and arnuity 100 one puff daily - prn albuterol - will arrange for chest xray and pulmonary function test  Dyspnea on exertion. - he has appointment with Dr. 40.0 with cardiology later this month  Obesity. - discussed how weight can impact sleep and risk for sleep disordered breathing - discussed options to assist with weight loss: combination of diet modification, cardiovascular and strength training exercises  Cardiovascular risk. - had an extensive discussion regarding the adverse health consequences related to untreated sleep disordered breathing - specifically discussed the risks for hypertension, coronary artery disease, cardiac dysrhythmias, cerebrovascular disease, and diabetes - lifestyle modification discussed  Safe driving practices. - discussed how sleep disruption can increase risk of accidents, particularly when driving - safe driving practices were discussed  Therapies for obstructive sleep apnea. - if the sleep study shows significant sleep apnea, then various therapies for treatment were reviewed: CPAP, oral appliance, and surgical interventions  Time Spent Involved in Patient Care on Day of Examination:  65 minutes  Follow up:   Patient Instructions  Chest xray today at [facility]  Will schedule pulmonary function test at [facility]  Flonase 1 spray in each nostril nightly  Arnuity 1 puff daily, and rinse your mouth after each use  Albuterol two puffs every 6 hours as needed for cough, wheeze, chest congestion or shortness of breath  Will arrange for home sleep study  Follow  up in 2 months  Medication List:   Allergies as of 11/30/2021       Reactions   Allegra [fexofenadine Hcl] Other (See Comments)   Made patient "feel badly"   Lipitor [atorvastatin] Other (See  Comments)   Headaches        Medication List        Accurate as of November 30, 2021  9:30 AM. If you have any questions, ask your nurse or doctor.          albuterol 108 (90 Base) MCG/ACT inhaler Commonly known as: VENTOLIN HFA Inhale 2 puffs into the lungs every 6 (six) hours as needed for wheezing or shortness of breath. Started by: Coralyn Helling, MD   Arnuity Ellipta 100 MCG/ACT Aepb Generic drug: Fluticasone Furoate Inhale 1 puff into the lungs every morning. Started by: Coralyn Helling, MD   atorvastatin 40 MG tablet Commonly known as: LIPITOR Take 1 tablet (40 mg total) by mouth daily.   fluticasone 50 MCG/ACT nasal spray Commonly known as: FLONASE Place 1 spray into both nostrils at bedtime. Started by: Coralyn Helling, MD        Signature:  Coralyn Helling, MD [facility] Pager - [phone] 11/30/2021, 9:30 AM

## 2021-11-30 NOTE — Patient Instructions (Addendum)
Chest xray today at [facility]  Will schedule pulmonary function test at [facility]  Flonase 1 spray in each nostril nightly  Arnuity 1 puff daily, and rinse your mouth after each use  Albuterol two puffs every 6 hours as needed for cough, wheeze, chest congestion or shortness of breath  Will arrange for home sleep study  Follow up in 2 months

## 2021-12-11 ENCOUNTER — Encounter: Payer: Self-pay | Admitting: Cardiology

## 2021-12-11 ENCOUNTER — Ambulatory Visit: Payer: 59 | Admitting: Cardiology

## 2021-12-11 VITALS — BP 130/84 | HR 64 | Ht 66.0 in | Wt 206.8 lb

## 2021-12-11 DIAGNOSIS — E782 Mixed hyperlipidemia: Secondary | ICD-10-CM

## 2021-12-11 DIAGNOSIS — R0602 Shortness of breath: Secondary | ICD-10-CM | POA: Diagnosis not present

## 2021-12-12 ENCOUNTER — Ambulatory Visit (INDEPENDENT_AMBULATORY_CARE_PROVIDER_SITE_OTHER): Payer: 59

## 2021-12-12 DIAGNOSIS — R0602 Shortness of breath: Secondary | ICD-10-CM | POA: Diagnosis not present

## 2021-12-12 LAB — ECHOCARDIOGRAM COMPLETE
Area-P 1/2: 3.5 cm2
Calc EF: 67.5 %
MV M vel: 1.9 m/s
MV Peak grad: 14.4 mmHg
S' Lateral: 2.99 cm
Single Plane A2C EF: 70.2 %
Single Plane A4C EF: 65.8 %

## 2021-12-13 ENCOUNTER — Telehealth: Payer: Self-pay | Admitting: *Deleted

## 2021-12-13 ENCOUNTER — Encounter: Payer: Self-pay | Admitting: *Deleted

## 2021-12-13 DIAGNOSIS — R0602 Shortness of breath: Secondary | ICD-10-CM

## 2021-12-13 NOTE — Telephone Encounter (Signed)
Patient informed and verbalized understanding of plan. Copy sent to PCP 

## 2021-12-13 NOTE — Telephone Encounter (Signed)
-----   Message from Jonelle Sidle, MD sent at 12/12/2021  3:07 PM EDT ----- Results reviewed.  Please let him know that the echocardiogram was reassuring, LVEF is vigorous at 65 to 70%, no clear evidence of pulmonary hypertension, and no significant valvular abnormalities.  He is undergoing further work-up for OSA and also has PFTs scheduled.  To complete cardiac work-up let's set up an outpatient Lexiscan Myoview mainly to exclude any progressive CAD since his catheterization in 2014.

## 2021-12-20 ENCOUNTER — Telehealth: Payer: Self-pay

## 2021-12-20 ENCOUNTER — Encounter (HOSPITAL_COMMUNITY): Payer: Self-pay

## 2021-12-20 ENCOUNTER — Encounter (HOSPITAL_COMMUNITY)
Admission: RE | Admit: 2021-12-20 | Discharge: 2021-12-20 | Disposition: A | Discharge status: Home / Self Care | Payer: 59 | Source: Ambulatory Visit | Attending: Cardiology | Admitting: Cardiology

## 2021-12-20 ENCOUNTER — Ambulatory Visit (HOSPITAL_COMMUNITY)
Admission: RE | Admit: 2021-12-20 | Discharge: 2021-12-20 | Disposition: A | Discharge status: Home / Self Care | Payer: 59 | Source: Ambulatory Visit | Attending: Cardiology | Admitting: Cardiology

## 2021-12-20 DIAGNOSIS — R0602 Shortness of breath: Secondary | ICD-10-CM | POA: Insufficient documentation

## 2021-12-20 LAB — NM MYOCAR MULTI W/SPECT W/WALL MOTION / EF
LV dias vol: 83 mL (ref 62–150)
LV sys vol: 28 mL
Nuc Stress EF: 66 %
Peak HR: 101 {beats}/min
RATE: 0.4
Rest HR: 57 {beats}/min
Rest Nuclear Isotope Dose: 11 mCi
SDS: 1
SRS: 0
SSS: 1
ST Depression (mm): 0 mm
Stress Nuclear Isotope Dose: 32 mCi
TID: 1.01

## 2021-12-20 MED ORDER — TECHNETIUM TC 99M TETROFOSMIN IV KIT
10.0000 | PACK | Freq: Once | INTRAVENOUS | Status: AC | PRN
  Administered 2021-12-20: 11 via INTRAVENOUS

## 2021-12-20 MED ORDER — SODIUM CHLORIDE FLUSH 0.9 % IV SOLN
INTRAVENOUS | Status: AC
  Administered 2021-12-20: 10 mL via INTRAVENOUS
  Filled 2021-12-20: qty 10

## 2021-12-20 MED ORDER — TECHNETIUM TC 99M TETROFOSMIN IV KIT
30.0000 | PACK | Freq: Once | INTRAVENOUS | Status: AC | PRN
  Administered 2021-12-20: 32 via INTRAVENOUS

## 2021-12-20 MED ORDER — REGADENOSON 0.4 MG/5ML IV SOLN
INTRAVENOUS | Status: AC
  Administered 2021-12-20: 0.4 mg via INTRAVENOUS
  Filled 2021-12-20: qty 5

## 2021-12-20 NOTE — Telephone Encounter (Signed)
-----   Message from Jonelle Sidle, MD sent at 12/20/2021 12:48 PM EDT ----- Results reviewed.  Stress testing was reassuring, low risk and no evidence of ischemia to suggest obstructive CAD as cause of his shortness of breath.  Do not anticipate further cardiac testing unless symptoms change.  Keep on track for pulmonary work-up that is ongoing.  Can keep follow-up with Dr. Gerda Diss.

## 2022-02-05 ENCOUNTER — Ambulatory Visit: Payer: 59 | Admitting: Pulmonary Disease

## 2022-02-11 ENCOUNTER — Ambulatory Visit (INDEPENDENT_AMBULATORY_CARE_PROVIDER_SITE_OTHER): Payer: 59

## 2022-02-11 ENCOUNTER — Ambulatory Visit
Admission: EM | Admit: 2022-02-11 | Discharge: 2022-02-11 | Disposition: A | Discharge status: Home / Self Care | Payer: 59

## 2022-02-11 DIAGNOSIS — M25572 Pain in left ankle and joints of left foot: Secondary | ICD-10-CM | POA: Diagnosis not present

## 2022-02-11 NOTE — ED Provider Notes (Signed)
[facility]    CSN: [id number] Arrival date & time: 02/11/22  1008      History   Chief Complaint Chief Complaint  Patient presents with   Ankle Pain    HPI Kenneth Moon. is a 66 y.o. male.   Presenting today with 1 day history of left lateral ankle pain, swelling, bruising after slipping off of the second rung of a ladder and hitting his ankle.  He states there is pain with weightbearing but has good range of motion.  Denies numbness, tingling, skin injury.  Advil not improving his pain at all.    Past Medical History:  Diagnosis Date   History of cardiac catheterization    Normal coronary arteries in 2014   History of migraine headaches    Hyperlipidemia    Rupture of biceps tendon    Right    Patient Active Problem List   Diagnosis Date Noted   Accelerating angina (HCC) 01/07/2013   Abnormal ECG 01/07/2013   Hyperlipidemia 01/07/2013   Migraines 11/26/2012    Past Surgical History:  Procedure Laterality Date   BACK SURGERY  2016   lumbar   DISTAL BICEPS TENDON REPAIR Right 11/24/2015   Procedure: REPAIR BICEPS RIGHT ELBOW ;  Surgeon: Cindee Salt, MD;  Location: [facility];  Service: Orthopedics;  Laterality: Right;   SKULL FRACTURE ELEVATION     Age 41       Home Medications    Prior to Admission medications   Medication Sig Start Date End Date Taking? Authorizing Provider  ibuprofen (ADVIL) 200 MG tablet Take 200 mg by mouth every 6 (six) hours as needed.   Yes [provider]  albuterol (VENTOLIN HFA) 108 (90 Base) MCG/ACT inhaler Inhale 2 puffs into the lungs every 6 (six) hours as needed for wheezing or shortness of breath. 11/30/21   Coralyn Helling, MD  atorvastatin (LIPITOR) 40 MG tablet Take 1 tablet (40 mg total) by mouth daily. 10/25/21   [employer], Alvino Chapel, NP  fluticasone (FLONASE) 50 MCG/ACT nasal spray Place 1 spray into both nostrils at bedtime. 11/30/21   Coralyn Helling, MD  Fluticasone Furoate (ARNUITY  ELLIPTA) 100 MCG/ACT AEPB Inhale 1 puff into the lungs every morning. 11/30/21   Coralyn Helling, MD    Family History Family History  Problem Relation Age of Onset   Cancer Mother    Cancer Father    CAD Father        Not premature    Social History Social History   Tobacco Use   Smoking status: Never   Smokeless tobacco: Never  Vaping Use   Vaping Use: Never used  Substance Use Topics   Alcohol use: No   Drug use: No     Allergies   Allegra [fexofenadine hcl] and Lipitor [atorvastatin]   Review of Systems Review of Systems Per HPI  Physical Exam Triage Vital Signs ED Triage Vitals  Enc Vitals Group     BP 02/11/22 1214 124/79     Pulse Rate 02/11/22 1214 64     Resp 02/11/22 1214 20     Temp 02/11/22 1214 98.2 F (36.8 C)     Temp Source 02/11/22 1214 Oral     SpO2 02/11/22 1214 98 %     Weight --      Height --      Head Circumference --      Peak Flow --      Pain Score 02/11/22 1210 7  Pain Loc --      Pain Edu? --      Excl. in GC? --    No data found.  Updated Vital Signs BP 124/79 (BP Location: Right Arm)   Pulse 64   Temp 98.2 F (36.8 C) (Oral)   Resp 20   SpO2 98%   Visual Acuity Right Eye Distance:   Left Eye Distance:   Bilateral Distance:    Right Eye Near:   Left Eye Near:    Bilateral Near:     Physical Exam Vitals and nursing note reviewed.  Constitutional:      Appearance: Normal appearance.  HENT:     Head: Atraumatic.     Mouth/Throat:     Mouth: Mucous membranes are moist.  Eyes:     Extraocular Movements: Extraocular movements intact.     Conjunctiva/sclera: Conjunctivae normal.  Cardiovascular:     Rate and Rhythm: Normal rate and regular rhythm.  Pulmonary:     Effort: Pulmonary effort is normal.     Breath sounds: Normal breath sounds.  Musculoskeletal:        General: Swelling, tenderness and signs of injury present. Normal range of motion.     Cervical back: Normal range of motion and neck supple.      Comments: Left lateral malleolus region and edematous, bruised, tender to palpation.  No bone deformity palpable.  Range of motion intact  Skin:    General: Skin is warm and dry.     Findings: Bruising present.  Neurological:     Mental Status: He is oriented to person, place, and time.     Comments: Left lower extremity neurovascularly intact  Psychiatric:        Mood and Affect: Mood normal.        Thought Content: Thought content normal.        Judgment: Judgment normal.      UC Treatments / Results  Labs (all labs ordered are listed, but only abnormal results are displayed) Labs Reviewed - No data to display  EKG   Radiology DG Ankle Complete Left  Result Date: 02/11/2022 CLINICAL DATA:  66 year old male with pain and swelling after stepped off ladder. Painful weight-bearing. EXAM: LEFT ANKLE COMPLETE - 3+ VIEW COMPARISON:  None Available. FINDINGS: Anterior and lateral soft tissue swelling. Mortise joint alignment is maintained. Talar dome appears intact. Calcaneus appears intact with mild degenerative spurring. No dislocation and no convincing fracture identified about the left ankle. IMPRESSION: Soft tissue swelling with no acute fracture or dislocation identified about the left ankle. Electronically Signed   By: Odessa Fleming M.D.   On: 02/11/2022 12:28    Procedures Procedures (including critical care time)  Medications Ordered in UC Medications - No data to display  Initial Impression / Assessment and Plan / UC Course  I have reviewed the triage vital signs and the nursing notes.  Pertinent labs & imaging results that were available during my care of the patient were reviewed by me and considered in my medical decision making (see chart for details).    X-ray the left ankle negative for acute bony abnormality.  Ace wrap applied, discussed RICE protocol, over-the-counter pain relievers and return precautions.  Final Clinical Impressions(s) / UC Diagnoses   Final  diagnoses:  Acute left ankle pain   Discharge Instructions   None    ED Prescriptions   None    PDMP not reviewed this encounter.   Particia Nearing, [place] 02/11/22 1505

## 2022-02-11 NOTE — ED Triage Notes (Signed)
Pt reports pain and swelling in left ankle pain x 1 day after he steep off a ladder. Reports pain when putting pressure on. Advil gives no relief.

## 2022-02-12 ENCOUNTER — Ambulatory Visit (INDEPENDENT_AMBULATORY_CARE_PROVIDER_SITE_OTHER): Payer: 59

## 2022-02-12 DIAGNOSIS — R0683 Snoring: Secondary | ICD-10-CM

## 2022-04-11 ENCOUNTER — Ambulatory Visit: Payer: 59 | Admitting: Pulmonary Disease

## 2022-06-27 ENCOUNTER — Encounter: Payer: Self-pay | Admitting: Family Medicine

## 2022-06-27 ENCOUNTER — Ambulatory Visit (INDEPENDENT_AMBULATORY_CARE_PROVIDER_SITE_OTHER): Payer: 59 | Admitting: Family Medicine

## 2022-06-27 VITALS — BP 127/73 | Ht 66.0 in | Wt 193.8 lb

## 2022-06-27 DIAGNOSIS — E785 Hyperlipidemia, unspecified: Secondary | ICD-10-CM | POA: Diagnosis not present

## 2022-06-27 DIAGNOSIS — Z1211 Encounter for screening for malignant neoplasm of colon: Secondary | ICD-10-CM

## 2022-06-27 DIAGNOSIS — Z0000 Encounter for general adult medical examination without abnormal findings: Secondary | ICD-10-CM

## 2022-06-27 DIAGNOSIS — R7301 Impaired fasting glucose: Secondary | ICD-10-CM | POA: Diagnosis not present

## 2022-06-27 DIAGNOSIS — Z0001 Encounter for general adult medical examination with abnormal findings: Secondary | ICD-10-CM

## 2022-06-27 DIAGNOSIS — F325 Major depressive disorder, single episode, in full remission: Secondary | ICD-10-CM | POA: Diagnosis not present

## 2022-06-27 NOTE — Progress Notes (Signed)
06/27/22-referral for colonoscopy placed

## 2022-06-27 NOTE — Addendum Note (Signed)
Addended by: Vicente Males on: 06/27/2022 02:02 PM   Modules accepted: Orders

## 2022-06-27 NOTE — Progress Notes (Signed)
   Subjective:    Patient ID: Kenneth Moon., male    DOB: 02-Jan-1956, 67 y.o.   MRN: [id number]  HPI The patient comes in today for a wellness visit.  Patient states he is going to start doing little more exercise little more walking.  States moods are doing well.  A review of their health history was completed.  A review of medications was also completed.  Any needed refills; none at this time  Eating habits: fine-no issues eating   Falls/  MVA accidents in past few months: none  Regular exercise: not regular-maybe 1-2 times a week   Specialist pt sees on regular basis: none  Preventative health issues were discussed.   Additional concerns:   Patient does not smoke or drink denies being depressed  Review of Systems     Objective:   Physical Exam  General-in no acute distress Eyes-no discharge Lungs-respiratory rate normal, CTA CV-no murmurs,RRR Extremities skin warm dry no edema Neuro grossly normal Behavior normal, alert Prostate exam normal Patient to do shingles vaccine Flu shot recommended      Assessment & Plan:  1. Well adult exam Adult wellness-complete.wellness physical was conducted today. Importance of diet and exercise were discussed in detail.  Importance of stress reduction and healthy living were discussed.  In addition to this a discussion regarding safety was also covered.  We also reviewed over immunizations and gave recommendations regarding current immunization needed for age.   In addition to this additional areas were also touched on including: Preventative health exams needed:  Colonoscopy colonoscopy referral he is due Follow-up in 6 months Patient was advised yearly wellness exam  - Lipid panel - Basic metabolic panel - Hepatic function panel - PSA - Hemoglobin A1c  2. Hyperlipidemia, unspecified hyperlipidemia type Check lipid continue medicine - Lipid panel - Basic metabolic panel - Hepatic function panel - PSA -  Hemoglobin A1c  3. Fasting hyperglycemia Check lab work healthy diet regular activity follow-up 6 months - Lipid panel - Basic metabolic panel - Hepatic function panel - PSA - Hemoglobin A1c  4. Major depressive disorder with single episode, in full remission [facility]) Patient is reconciling with his wife depression in full remission

## 2022-06-27 NOTE — Progress Notes (Signed)
06/27/22-My chart message sent to patient

## 2022-06-27 NOTE — Patient Instructions (Signed)

## 2022-06-28 LAB — HEPATIC FUNCTION PANEL
ALT: 18 IU/L (ref 0–44)
AST: 16 IU/L (ref 0–40)
Albumin: 4.2 g/dL (ref 3.9–4.9)
Alkaline Phosphatase: 89 IU/L (ref 44–121)
Bilirubin Total: 0.5 mg/dL (ref 0.0–1.2)
Bilirubin, Direct: 0.14 mg/dL (ref 0.00–0.40)
Total Protein: 6.9 g/dL (ref 6.0–8.5)

## 2022-06-28 LAB — HEMOGLOBIN A1C
Est. average glucose Bld gHb Est-mCnc: 123 mg/dL
Hgb A1c MFr Bld: 5.9 % — ABNORMAL HIGH (ref 4.8–5.6)

## 2022-06-28 LAB — LIPID PANEL
Chol/HDL Ratio: 4.3 ratio (ref 0.0–5.0)
Cholesterol, Total: 159 mg/dL (ref 100–199)
HDL: 37 mg/dL — ABNORMAL LOW (ref >39)
LDL Chol Calc (NIH): 107 mg/dL — ABNORMAL HIGH (ref 0–99)
Triglycerides: 78 mg/dL (ref 0–149)
VLDL Cholesterol Cal: 15 mg/dL (ref 5–40)

## 2022-06-28 LAB — BASIC METABOLIC PANEL
BUN/Creatinine Ratio: 19 (ref 10–24)
BUN: 19 mg/dL (ref 8–27)
CO2: 23 mmol/L (ref 20–29)
Calcium: 9.1 mg/dL (ref 8.6–10.2)
Chloride: 105 mmol/L (ref 96–106)
Creatinine, Ser: 0.99 mg/dL (ref 0.76–1.27)
Glucose: 99 mg/dL (ref 70–99)
Potassium: 4.8 mmol/L (ref 3.5–5.2)
Sodium: 142 mmol/L (ref 134–144)
eGFR: 85 mL/min/{1.73_m2} (ref >59)

## 2022-06-28 LAB — PSA: Prostate Specific Ag, Serum: 1.1 ng/mL (ref 0.0–4.0)

## 2022-06-29 ENCOUNTER — Encounter: Payer: Self-pay | Admitting: *Deleted

## 2022-07-29 ENCOUNTER — Encounter: Payer: Self-pay | Admitting: Family Medicine

## 2022-07-29 NOTE — Progress Notes (Signed)
Please mail to the patient-he is not reading his MyChart messages

## 2022-12-11 ENCOUNTER — Other Ambulatory Visit: Payer: Self-pay

## 2022-12-11 ENCOUNTER — Ambulatory Visit
Admission: RE | Admit: 2022-12-11 | Discharge: 2022-12-11 | Disposition: A | Discharge status: Home / Self Care | Payer: Medicare HMO | Source: Ambulatory Visit | Attending: Family Medicine | Admitting: Family Medicine

## 2022-12-11 VITALS — BP 154/93 | HR 62 | Temp 97.8°F | Resp 20

## 2022-12-11 DIAGNOSIS — H6992 Unspecified Eustachian tube disorder, left ear: Secondary | ICD-10-CM

## 2022-12-11 NOTE — ED Triage Notes (Signed)
Pt reports left ear/eye fullness since last night. Reports intermittent dizziness.

## 2022-12-11 NOTE — Discharge Instructions (Signed)
Start Flonase nasal spray to help with the fluid behind the left which should help with symptoms.  Seek care if symptoms persist or worsen despite treatment.

## 2022-12-11 NOTE — ED Provider Notes (Signed)
[facility]    CSN: [id number] Arrival date & time: 12/11/22  2595      History   Chief Complaint Chief Complaint  Patient presents with   Ear Fullness    Entered by patient    HPI Kenneth Moon. is a 67 y.o. male.   Patient presents today with left ear clogged sensation, ringing in the ear, and occasional pain since last night.  He denies ear drainage, fever, recent cough, congestion, or sore throat.  Does not use Q-tips.  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  Reports history of ceruminosis.  Also has been sneezing, and has had itchy/watery eyes for the past few days or so.  Has not tried anything for symptoms so far.    Past Medical History:  Diagnosis Date   History of cardiac catheterization    Normal coronary arteries in 2014   History of migraine headaches    Hyperlipidemia    Rupture of biceps tendon    Right    Patient Active Problem List   Diagnosis Date Noted   Accelerating angina (HCC) 01/07/2013   Abnormal ECG 01/07/2013   Hyperlipidemia 01/07/2013   Migraines 11/26/2012    Past Surgical History:  Procedure Laterality Date   BACK SURGERY  2016   lumbar   DISTAL BICEPS TENDON REPAIR Right 11/24/2015   Procedure: REPAIR BICEPS RIGHT ELBOW ;  Surgeon: Cindee Salt, MD;  Location: [facility];  Service: Orthopedics;  Laterality: Right;   SKULL FRACTURE ELEVATION     Age 36       Home Medications    Prior to Admission medications   Medication Sig Start Date End Date Taking? Authorizing Provider  atorvastatin (LIPITOR) 40 MG tablet Take 1 tablet (40 mg total) by mouth daily. 10/25/21   [employer], Alvino Chapel, FNP    Family History Family History  Problem Relation Age of Onset   Cancer Mother    Cancer Father    CAD Father        Not premature    Social History Social History   Tobacco Use   Smoking status: Never   Smokeless tobacco: Never  Vaping Use   Vaping Use: Never used  Substance Use Topics   Alcohol  use: No   Drug use: No     Allergies   Allegra [fexofenadine hcl] and Lipitor [atorvastatin]   Review of Systems Review of Systems Per HPI  Physical Exam Triage Vital Signs ED Triage Vitals [12/11/22 1008]  Enc Vitals Group     BP (!) 154/93     Pulse Rate 62     Resp 20     Temp 97.8 F (36.6 C)     Temp Source Oral     SpO2 98 %     Weight      Height      Head Circumference      Peak Flow      Pain Score 4     Pain Loc      Pain Edu?      Excl. in GC?    No data found.  Updated Vital Signs BP (!) 154/93 (BP Location: Right Arm)   Pulse 62   Temp 97.8 F (36.6 C) (Oral)   Resp 20   SpO2 98%   Visual Acuity Right Eye Distance:   Left Eye Distance:   Bilateral Distance:    Right Eye Near:   Left Eye Near:  Bilateral Near:     Physical Exam Vitals and nursing note reviewed.  Constitutional:      General: He is not in acute distress.    Appearance: Normal appearance. He is not toxic-appearing.  HENT:     Head: Normocephalic and atraumatic.     Right Ear: Ear canal and external ear normal. A middle ear effusion is present. There is no impacted cerumen. Tympanic membrane is not bulging.     Left Ear: A middle ear effusion is present. There is no impacted cerumen. Tympanic membrane is not bulging.     Nose: Nose normal. No congestion or rhinorrhea.     Mouth/Throat:     Mouth: Mucous membranes are moist.     Pharynx: Oropharynx is clear. No oropharyngeal exudate or posterior oropharyngeal erythema.  Eyes:     General: No scleral icterus.    Extraocular Movements: Extraocular movements intact.  Pulmonary:     Effort: Pulmonary effort is normal. No respiratory distress.  Musculoskeletal:     Cervical back: Normal range of motion.  Lymphadenopathy:     Cervical: No cervical adenopathy.  Skin:    General: Skin is warm and dry.     Capillary Refill: Capillary refill takes less than 2 seconds.     Coloration: Skin is not jaundiced or pale.      Findings: No erythema.  Neurological:     Mental Status: He is alert and oriented to person, place, and time.  Psychiatric:        Behavior: Behavior is cooperative.      UC Treatments / Results  Labs (all labs ordered are listed, but only abnormal results are displayed) Labs Reviewed - No data to display  EKG   Radiology No results found.  Procedures Procedures (including critical care time)  Medications Ordered in UC Medications - No data to display  Initial Impression / Assessment and Plan / UC Course  I have reviewed the triage vital signs and the nursing notes.  Pertinent labs & imaging results that were available during my care of the patient were reviewed by me and considered in my medical decision making (see chart for details).   Patient is well-appearing, afebrile, not tachycardic, not tachypneic, oxygenating well on room air.  Patient is mildly hypertensive today in urgent care.  1. Eustachian tube dysfunction, left Start Flonase nasal spray Discussed no ceruminosis on exam today Follow-up with PCP with no improvement or worsening of symptoms despite treatment  The patient was given the opportunity to ask questions.  All questions answered to their satisfaction.  The patient is in agreement to this plan.    Final Clinical Impressions(s) / UC Diagnoses   Final diagnoses:  Eustachian tube dysfunction, left     Discharge Instructions      Start Flonase nasal spray to help with the fluid behind the left which should help with symptoms.  Seek care if symptoms persist or worsen despite treatment.   ED Prescriptions   None    PDMP not reviewed this encounter.   Valentino Nose, NP 12/11/22 1032

## 2022-12-18 ENCOUNTER — Encounter: Payer: Self-pay | Admitting: *Deleted

## 2022-12-26 ENCOUNTER — Ambulatory Visit (INDEPENDENT_AMBULATORY_CARE_PROVIDER_SITE_OTHER): Payer: Medicare HMO | Admitting: Family Medicine

## 2022-12-26 ENCOUNTER — Encounter: Payer: Self-pay | Admitting: Family Medicine

## 2022-12-26 ENCOUNTER — Other Ambulatory Visit: Payer: Self-pay

## 2022-12-26 VITALS — BP 112/72 | HR 64 | Wt 200.4 lb

## 2022-12-26 DIAGNOSIS — R7303 Prediabetes: Secondary | ICD-10-CM

## 2022-12-26 DIAGNOSIS — E785 Hyperlipidemia, unspecified: Secondary | ICD-10-CM | POA: Diagnosis not present

## 2022-12-26 DIAGNOSIS — E782 Mixed hyperlipidemia: Secondary | ICD-10-CM

## 2022-12-26 DIAGNOSIS — I10 Essential (primary) hypertension: Secondary | ICD-10-CM

## 2022-12-26 DIAGNOSIS — N400 Enlarged prostate without lower urinary tract symptoms: Secondary | ICD-10-CM

## 2022-12-26 DIAGNOSIS — Z125 Encounter for screening for malignant neoplasm of prostate: Secondary | ICD-10-CM

## 2022-12-26 MED ORDER — ATORVASTATIN CALCIUM 40 MG PO TABS: 40.0000 mg | ORAL_TABLET | Freq: Every day | ORAL | 1 refills | Status: DC

## 2022-12-26 NOTE — Progress Notes (Signed)
   Subjective:    Patient ID: Kenneth Moon., male    DOB: 21-Apr-1956, 67 y.o.   MRN: [id number]  HPI Patient arrives today for 6 month follow up. Patient states no concerns or issues today.  Patient here for follow-up regarding cholesterol.    Patient relates taking medication on a regular basis Denies problems with medication Importance of dietary measures discussed Regular lab work regarding lipid and liver was checked and if needing additional labs was appropriately ordered  Mixed hyperlipidemia  Hyperlipidemia, unspecified hyperlipidemia type - Plan: atorvastatin (LIPITOR) 40 MG tablet  Review of Systems     Objective:   Physical Exam  General-in no acute distress Eyes-no discharge Lungs-respiratory rate normal, CTA CV-no murmurs,RRR Extremities skin warm dry no edema Neuro grossly normal Behavior normal, alert Patient's depression screen negative he states he is doing very well  Patient is trying to eat healthy stay active we did discuss best ways of doing that    Assessment & Plan:  Hyperlipidemia-continue medication refills given He is due for lab work for other related issues we will complete that lab work in September with welcome to Medicare visit at that time

## 2023-02-27 ENCOUNTER — Ambulatory Visit (INDEPENDENT_AMBULATORY_CARE_PROVIDER_SITE_OTHER): Payer: Medicare HMO | Admitting: Family Medicine

## 2023-02-27 ENCOUNTER — Encounter: Payer: Self-pay | Admitting: Family Medicine

## 2023-02-27 VITALS — BP 109/69 | HR 54 | Temp 98.6°F | Ht 66.0 in | Wt 186.6 lb

## 2023-02-27 DIAGNOSIS — E785 Hyperlipidemia, unspecified: Secondary | ICD-10-CM

## 2023-02-27 DIAGNOSIS — Z0001 Encounter for general adult medical examination with abnormal findings: Secondary | ICD-10-CM

## 2023-02-27 DIAGNOSIS — Z0000 Encounter for general adult medical examination without abnormal findings: Secondary | ICD-10-CM

## 2023-02-27 MED ORDER — ATORVASTATIN CALCIUM 40 MG PO TABS: 40.0000 mg | ORAL_TABLET | Freq: Every day | ORAL | 1 refills | Status: AC

## 2023-02-27 NOTE — Progress Notes (Signed)
   Subjective:    Patient ID: Kenneth Moon., male    DOB: 1955-11-07, 67 y.o.   MRN: [id number]  HPI Pt comes in today for 2 month follow up. No questions or concerns at this time. Pt would like flu shot. Here today for welcome to Medicare His overall health is doing well He is not a smoker or drinker Moods are going well Dietary going well Exercising regular basis Has loss 15 to 20 pounds He is due for a colonoscopy We reviewed all immunizations and recommendations today He does take his medications on a regular basis Drives well no accidents No falls or injuries Denies any depression Memory is doing well short-term memory good cognitive doing well  Review of Systems     Objective:   Physical Exam  General-in no acute distress Eyes-no discharge Lungs-respiratory rate normal, CTA CV-no murmurs,RRR Extremities skin warm dry no edema Neuro grossly normal Behavior normal, alert Neurologic grossly normal no edema in the legs no murmurs with the heart pulse normal abdomen soft patient defers prostate exam labs reviewed in detail  Results for orders placed or performed in visit on 06/27/22  Lipid panel  Result Value Ref Range   Cholesterol, Total 159 100 - 199 mg/dL   Triglycerides 78 0 - 149 mg/dL   HDL 37 (L) >40 mg/dL   VLDL Cholesterol Cal 15 5 - 40 mg/dL   LDL Chol Calc (NIH) 981 (H) 0 - 99 mg/dL   Chol/HDL Ratio 4.3 0.0 - 5.0 ratio  Basic metabolic panel  Result Value Ref Range   Glucose 99 70 - 99 mg/dL   BUN 19 8 - 27 mg/dL   Creatinine, Ser 1.91 0.76 - 1.27 mg/dL   eGFR 85 >47 WG/NFA/2.13   BUN/Creatinine Ratio 19 10 - 24   Sodium 142 134 - 144 mmol/L   Potassium 4.8 3.5 - 5.2 mmol/L   Chloride 105 96 - 106 mmol/L   CO2 23 20 - 29 mmol/L   Calcium 9.1 8.6 - 10.2 mg/dL  Hepatic function panel  Result Value Ref Range   Total Protein 6.9 6.0 - 8.5 g/dL   Albumin 4.2 3.9 - 4.9 g/dL   Bilirubin Total 0.5 0.0 - 1.2 mg/dL   Bilirubin, Direct 0.86 0.00 -  0.40 mg/dL   Alkaline Phosphatase 89 44 - 121 IU/L   AST 16 0 - 40 IU/L   ALT 18 0 - 44 IU/L  PSA  Result Value Ref Range   Prostate Specific Ag, Serum 1.1 0.0 - 4.0 ng/mL  Hemoglobin A1c  Result Value Ref Range   Hgb A1c MFr Bld 5.9 (H) 4.8 - 5.6 %   Est. average glucose Bld gHb Est-mCnc 123 mg/dL         Assessment & Plan:  Preventative measures discussed Adult wellness-complete.wellness physical was conducted today. Importance of diet and exercise were discussed in detail.  Importance of stress reduction and healthy living were discussed.  In addition to this a discussion regarding safety was also covered.  We also reviewed over immunizations and gave recommendations regarding current immunization needed for age.   In addition to this additional areas were also touched on including: Preventative health exams needed:  Colonoscopy referral for colonoscopy  Patient was advised yearly wellness exam  A1c slightly elevated healthy diet recommended repeat this in 1 years time  Follow-up in 1 year sooner if any problems patient defers on flu shot currently

## 2023-02-27 NOTE — Patient Instructions (Signed)

## 2023-03-26 ENCOUNTER — Ambulatory Visit: Payer: Medicare Other

## 2023-04-10 DIAGNOSIS — H524 Presbyopia: Secondary | ICD-10-CM | POA: Diagnosis not present

## 2023-04-10 DIAGNOSIS — H2512 Age-related nuclear cataract, left eye: Secondary | ICD-10-CM | POA: Diagnosis not present

## 2023-04-10 DIAGNOSIS — H2511 Age-related nuclear cataract, right eye: Secondary | ICD-10-CM | POA: Diagnosis not present

## 2023-04-10 DIAGNOSIS — H5203 Hypermetropia, bilateral: Secondary | ICD-10-CM | POA: Diagnosis not present

## 2023-04-10 DIAGNOSIS — H52209 Unspecified astigmatism, unspecified eye: Secondary | ICD-10-CM | POA: Diagnosis not present

## 2023-07-26 ENCOUNTER — Ambulatory Visit
Admission: EM | Admit: 2023-07-26 | Discharge: 2023-07-26 | Disposition: A | Discharge status: Home / Self Care | Payer: HMO | Attending: Family Medicine | Admitting: Family Medicine

## 2023-07-26 DIAGNOSIS — R1013 Epigastric pain: Secondary | ICD-10-CM

## 2023-07-26 MED ORDER — ALUM & MAG HYDROXIDE-SIMETH 200-200-20 MG/5ML PO SUSP
30.0000 mL | Freq: Once | ORAL | Status: AC
  Administered 2023-07-26: 30 mL via ORAL

## 2023-07-26 MED ORDER — SUCRALFATE 1 G PO TABS: 1.0000 g | ORAL_TABLET | Freq: Three times a day (TID) | ORAL | 0 refills | Status: DC | PRN

## 2023-07-26 MED ORDER — LIDOCAINE VISCOUS HCL 2 % MT SOLN
15.0000 mL | Freq: Once | OROMUCOSAL | Status: AC
  Administered 2023-07-26: 15 mL via OROMUCOSAL

## 2023-07-26 MED ORDER — PANTOPRAZOLE SODIUM 40 MG PO TBEC: 40.0000 mg | DELAYED_RELEASE_TABLET | Freq: Every day | ORAL | 0 refills | Status: DC

## 2023-07-26 NOTE — ED Provider Notes (Signed)
 [facility]    CSN: [id number] Arrival date & time: 07/26/23  1615      History   Chief Complaint No chief complaint on file.   HPI Kenneth Moon. is a 68 y.o. male.   Patient presenting today with 2-day history of epigastric abdominal pain, pressure, gas, belching.  States after he belches he has some temporary relief but then the pain and pressure comes back.  Denies vomiting, bowel changes, hematemesis, melena, chest pain, shortness of breath, palpitations, new foods or medications.  Trying Tums and other over-the-counter remedies with only mild relief.    Past Medical History:  Diagnosis Date   History of cardiac catheterization    Normal coronary arteries in 2014   History of migraine headaches    Hyperlipidemia    Rupture of biceps tendon    Right    Patient Active Problem List   Diagnosis Date Noted   Accelerating angina (HCC) 01/07/2013   Abnormal ECG 01/07/2013   Hyperlipidemia 01/07/2013   Migraines 11/26/2012    Past Surgical History:  Procedure Laterality Date   BACK SURGERY  2016   lumbar   DISTAL BICEPS TENDON REPAIR Right 11/24/2015   Procedure: REPAIR BICEPS RIGHT ELBOW ;  Surgeon: Arley Curia, MD;  Location: [facility];  Service: Orthopedics;  Laterality: Right;   SKULL FRACTURE ELEVATION     Age 36       Home Medications    Prior to Admission medications   Medication Sig Start Date End Date Taking? Authorizing Provider  pantoprazole  (PROTONIX ) 40 MG tablet Take 1 tablet (40 mg total) by mouth daily. 07/26/23  Yes Stuart Vernell Norris, PA-C  sucralfate  (CARAFATE ) 1 g tablet Take 1 tablet (1 g total) by mouth 3 (three) times daily as needed. May dissolve 1 tablet into glass of water and drink up to 3 times daily prior to meals 07/26/23  Yes Stuart Vernell Norris, PA-C  atorvastatin  (LIPITOR) 40 MG tablet Take 1 tablet (40 mg total) by mouth daily. 02/27/23   Alphonsa Glendia LABOR, MD    Family History Family History   Problem Relation Age of Onset   Cancer Mother    Hyperlipidemia Father    Cancer Father 71       prostate mid 44s   CAD Father        Not premature    Social History Social History   Tobacco Use   Smoking status: Never   Smokeless tobacco: Never  Vaping Use   Vaping status: Never Used  Substance Use Topics   Alcohol use: No   Drug use: No     Allergies   Allegra [fexofenadine hcl] and Lipitor [atorvastatin ]   Review of Systems Review of Systems Per HPI  Physical Exam Triage Vital Signs ED Triage Vitals  Encounter Vitals Group     BP 07/26/23 1640 137/79     Systolic BP Percentile --      Diastolic BP Percentile --      Pulse Rate 07/26/23 1640 69     Resp 07/26/23 1640 16     Temp 07/26/23 1640 98.4 F (36.9 C)     Temp Source 07/26/23 1640 Oral     SpO2 07/26/23 1640 96 %     Weight --      Height --      Head Circumference --      Peak Flow --      Pain Score 07/26/23 1641 5  Pain Loc --      Pain Education --      Exclude from Growth Chart --    No data found.  Updated Vital Signs BP 137/79 (BP Location: Right Arm)   Pulse 69   Temp 98.4 F (36.9 C) (Oral)   Resp 16   SpO2 96%   Visual Acuity Right Eye Distance:   Left Eye Distance:   Bilateral Distance:    Right Eye Near:   Left Eye Near:    Bilateral Near:     Physical Exam Vitals and nursing note reviewed.  Constitutional:      Appearance: Normal appearance.  HENT:     Head: Atraumatic.     Mouth/Throat:     Mouth: Mucous membranes are moist.     Pharynx: Oropharynx is clear.  Eyes:     Extraocular Movements: Extraocular movements intact.     Conjunctiva/sclera: Conjunctivae normal.  Cardiovascular:     Rate and Rhythm: Normal rate and regular rhythm.  Pulmonary:     Effort: Pulmonary effort is normal.     Breath sounds: Normal breath sounds.  Abdominal:     General: Bowel sounds are normal. There is no distension.     Palpations: Abdomen is soft.     Tenderness:  There is abdominal tenderness. There is no right CVA tenderness, left CVA tenderness or guarding.  Musculoskeletal:        General: Normal range of motion.     Cervical back: Normal range of motion and neck supple.  Skin:    General: Skin is warm and dry.  Neurological:     General: No focal deficit present.     Mental Status: He is oriented to person, place, and time.     Motor: No weakness.     Gait: Gait normal.  Psychiatric:        Mood and Affect: Mood normal.        Thought Content: Thought content normal.        Judgment: Judgment normal.      UC Treatments / Results  Labs (all labs ordered are listed, but only abnormal results are displayed) Labs Reviewed - No data to display  EKG   Radiology No results found.  Procedures Procedures (including critical care time)  Medications Ordered in UC Medications  alum & mag hydroxide-simeth (MAALOX/MYLANTA) 200-200-20 MG/5ML suspension 30 mL (30 mLs Oral Given 07/26/23 1734)  lidocaine  (XYLOCAINE ) 2 % viscous mouth solution 15 mL (15 mLs Mouth/Throat Given 07/26/23 1734)    Initial Impression / Assessment and Plan / UC Course  I have reviewed the triage vital signs and the nursing notes.  Pertinent labs & imaging results that were available during my care of the patient were reviewed by me and considered in my medical decision making (see chart for details).     GI cocktail given in clinic, suspect GERD versus gastritis.  Trial Protonix , Carafate , bland foods, good hydration.  Return for any worsening symptoms.  Vitals and exam very reassuring with no red flag findings.  Final Clinical Impressions(s) / UC Diagnoses   Final diagnoses:  Abdominal pain, epigastric     Discharge Instructions      I suspect some acid reflux/stomach irritation causing her symptoms.  We have given you a GI cocktail to drink today to help soothe and coat your esophagus and stomach lining.  I have sent over an acid reflux medication and a  soothing medication for you to try over the  next several weeks in addition to bland foods, good hydration.  Follow-up with your primary care provider if not resolving    ED Prescriptions     Medication Sig Dispense Auth. Provider   sucralfate  (CARAFATE ) 1 g tablet Take 1 tablet (1 g total) by mouth 3 (three) times daily as needed. May dissolve 1 tablet into glass of water and drink up to 3 times daily prior to meals 30 tablet Stuart Vernell Norris, PA-C   pantoprazole  (PROTONIX ) 40 MG tablet Take 1 tablet (40 mg total) by mouth daily. 14 tablet Stuart Vernell Norris, [place]      PDMP not reviewed this encounter.   Stuart Vernell Norris, [place] 07/26/23 1818

## 2023-07-26 NOTE — ED Triage Notes (Signed)
 Pt reports he has some indigestion/ gas in the upper part of his stomach that will not go away x 2 days   Took tums but no relief

## 2023-07-26 NOTE — Discharge Instructions (Signed)
 I suspect some acid reflux/stomach irritation causing her symptoms.  We have given you a GI cocktail to drink today to help soothe and coat your esophagus and stomach lining.  I have sent over an acid reflux medication and a soothing medication for you to try over the next several weeks in addition to bland foods, good hydration.  Follow-up with your primary care provider if not resolving

## 2023-08-11 DIAGNOSIS — H6692 Otitis media, unspecified, left ear: Secondary | ICD-10-CM | POA: Diagnosis not present

## 2023-08-11 DIAGNOSIS — G43009 Migraine without aura, not intractable, without status migrainosus: Secondary | ICD-10-CM | POA: Diagnosis not present

## 2023-08-11 DIAGNOSIS — R110 Nausea: Secondary | ICD-10-CM | POA: Diagnosis not present

## 2023-10-02 DIAGNOSIS — M25561 Pain in right knee: Secondary | ICD-10-CM | POA: Diagnosis not present

## 2023-10-16 DIAGNOSIS — M25561 Pain in right knee: Secondary | ICD-10-CM | POA: Diagnosis not present

## 2023-10-23 ENCOUNTER — Ambulatory Visit
Admission: EM | Admit: 2023-10-23 | Discharge: 2023-10-23 | Disposition: A | Discharge status: Home / Self Care | Attending: Nurse Practitioner | Admitting: Nurse Practitioner

## 2023-10-23 ENCOUNTER — Encounter: Payer: Self-pay | Admitting: Emergency Medicine

## 2023-10-23 DIAGNOSIS — H6121 Impacted cerumen, right ear: Secondary | ICD-10-CM

## 2023-10-23 NOTE — ED Provider Notes (Signed)
 [facility]    CSN: [id number] Arrival date & time: 10/23/23  1349      History   Chief Complaint No chief complaint on file.   HPI Kenneth Moon. is a 68 y.o. male.   Patient presents requesting evaluation for the new onset of right ear pressure that started this morning.  He has mildly decreased hearing as well.  No other symptoms such as fever, nasal congestion, sore throat, headache, cough, chest pain, shortness of breath, abdominal pain, vomiting, or diarrhea.  No medications or interventions have been done for this thus far.  The history is provided by the patient.    Past Medical History:  Diagnosis Date   History of cardiac catheterization    Normal coronary arteries in 2014   History of migraine headaches    Hyperlipidemia    Rupture of biceps tendon    Right    Patient Active Problem List   Diagnosis Date Noted   Accelerating angina (HCC) 01/07/2013   Abnormal ECG 01/07/2013   Hyperlipidemia 01/07/2013   Migraines 11/26/2012    Past Surgical History:  Procedure Laterality Date   BACK SURGERY  2016   lumbar   DISTAL BICEPS TENDON REPAIR Right 11/24/2015   Procedure: REPAIR BICEPS RIGHT ELBOW ;  Surgeon: Lyanne Sample, MD;  Location: [facility];  Service: Orthopedics;  Laterality: Right;   SKULL FRACTURE ELEVATION     Age 41       Home Medications    Prior to Admission medications   Medication Sig Start Date End Date Taking? Authorizing Provider  atorvastatin  (LIPITOR) 40 MG tablet Take 1 tablet (40 mg total) by mouth daily. 02/27/23   Bennet Brasil, MD    Family History Family History  Problem Relation Age of Onset   Cancer Mother    Hyperlipidemia Father    Cancer Father 60       prostate mid 35s   CAD Father        Not premature    Social History Social History   Tobacco Use   Smoking status: Never   Smokeless tobacco: Never  Vaping Use   Vaping status: Never Used  Substance Use Topics   Alcohol use: No    Drug use: No     Allergies   Allegra [fexofenadine hcl] and Lipitor [atorvastatin ]   Review of Systems Review of Systems  Constitutional:  Negative for chills, fatigue and fever.  HENT:  Positive for hearing loss. Negative for congestion, ear pain and rhinorrhea.        Positive for right ear pressure, decreased hearing.  Respiratory:  Negative for cough and shortness of breath.   Cardiovascular:  Negative for chest pain and palpitations.  Gastrointestinal:  Negative for abdominal pain, diarrhea, nausea and vomiting.  Genitourinary:  Negative for dysuria.  Musculoskeletal:  Negative for arthralgias and myalgias.  Skin:  Negative for rash.  Neurological:  Negative for dizziness and headaches.     Physical Exam Triage Vital Signs ED Triage Vitals  Encounter Vitals Group     BP 10/23/23 1354 (!) 140/82     Systolic BP Percentile --      Diastolic BP Percentile --      Pulse Rate 10/23/23 1354 75     Resp 10/23/23 1354 18     Temp 10/23/23 1354 98 F (36.7 C)     Temp Source 10/23/23 1354 Oral     SpO2 10/23/23 1354 95 %  Weight --      Height --      Head Circumference --      Peak Flow --      Pain Score 10/23/23 1355 0     Pain Loc --      Pain Education --      Exclude from Growth Chart --    No data found.  Updated Vital Signs BP (!) 140/82 (BP Location: Right Arm)   Pulse 75   Temp 98 F (36.7 C) (Oral)   Resp 18   SpO2 95%   Visual Acuity Right Eye Distance:   Left Eye Distance:   Bilateral Distance:    Right Eye Near:   Left Eye Near:    Bilateral Near:     Physical Exam Vitals and nursing note reviewed.  Constitutional:      Appearance: Normal appearance.  HENT:     Head: Normocephalic.     Right Ear: There is impacted cerumen.     Left Ear: Tympanic membrane, ear canal and external ear normal. There is no impacted cerumen.     Nose: Nose normal.     Mouth/Throat:     Mouth: Mucous membranes are moist.     Pharynx: No posterior  oropharyngeal erythema.  Eyes:     Extraocular Movements: Extraocular movements intact.     Conjunctiva/sclera: Conjunctivae normal.     Pupils: Pupils are equal, round, and reactive to light.  Cardiovascular:     Rate and Rhythm: Normal rate and regular rhythm.     Heart sounds: Normal heart sounds.  Pulmonary:     Effort: Pulmonary effort is normal.     Breath sounds: Normal breath sounds.  Abdominal:     General: Bowel sounds are normal.  Skin:    General: Skin is warm and dry.  Neurological:     General: No focal deficit present.     Mental Status: He is alert and oriented to person, place, and time.  Psychiatric:        Mood and Affect: Mood normal.        Behavior: Behavior normal.        Thought Content: Thought content normal.        Judgment: Judgment normal.      UC Treatments / Results  Labs (all labs ordered are listed, but only abnormal results are displayed) Labs Reviewed - No data to display  EKG   Radiology No results found.  Procedures Procedures (including critical care time)  Medications Ordered in UC Medications - No data to display  Initial Impression / Assessment and Plan / UC Course  I have reviewed the triage vital signs and the nursing notes.  Pertinent labs & imaging results that were available during my care of the patient were reviewed by me and considered in my medical decision making (see chart for details).    Patient presenting for evaluation of his right ear after he noted pressure and decreased hearing this morning.  On physical exam, he does have a cerumen impaction which was successfully irrigated away.  Following the procedure, the ear canal was clear and his tympanic membrane was intact.  Patient verbalized an improvement in the symptoms and his hearing had improved as well.  No further concerns were voiced.  Final Clinical Impressions(s) / UC Diagnoses   Final diagnoses:  Impacted cerumen of right ear     Discharge  Instructions      Your ear was successfully irrigated of  the cerumen that had closed off your ear canal.  May use Debrox periodically in your ear and gently rinsing in the shower to help prevent this from happening again.     ED Prescriptions   None    PDMP not reviewed this encounter.   Genene Kennel, FNP 10/23/23 [phone]

## 2023-10-23 NOTE — ED Triage Notes (Signed)
 Right ear pressure that started this morning.

## 2023-10-23 NOTE — Discharge Instructions (Signed)
 Your ear was successfully irrigated of the cerumen that had closed off your ear canal.  May use Debrox periodically in your ear and gently rinsing in the shower to help prevent this from happening again.

## 2024-02-19 ENCOUNTER — Ambulatory Visit: Payer: Self-pay
